# Patient Record
Sex: Female | Born: 1955
Health system: Southern US, Community
[De-identification: ages and names within clinical notes are randomized; demographics above are authoritative.]

## PROBLEM LIST (undated history)

## (undated) DIAGNOSIS — K5900 Constipation, unspecified: Secondary | ICD-10-CM

## (undated) DIAGNOSIS — M549 Dorsalgia, unspecified: Secondary | ICD-10-CM

## (undated) DIAGNOSIS — L509 Urticaria, unspecified: Secondary | ICD-10-CM

## (undated) DIAGNOSIS — M542 Cervicalgia: Secondary | ICD-10-CM

## (undated) DIAGNOSIS — E119 Type 2 diabetes mellitus without complications: Secondary | ICD-10-CM

## (undated) DIAGNOSIS — E785 Hyperlipidemia, unspecified: Secondary | ICD-10-CM

## (undated) DIAGNOSIS — I10 Essential (primary) hypertension: Secondary | ICD-10-CM

## (undated) DIAGNOSIS — Z0000 Encounter for general adult medical examination without abnormal findings: Secondary | ICD-10-CM

## (undated) DIAGNOSIS — Z79890 Hormone replacement therapy (postmenopausal): Secondary | ICD-10-CM

## (undated) DIAGNOSIS — E669 Obesity, unspecified: Secondary | ICD-10-CM

## (undated) DIAGNOSIS — A5901 Trichomonal vulvovaginitis: Secondary | ICD-10-CM

## (undated) HISTORY — DX: Trichomonal vulvovaginitis: A59.01

## (undated) HISTORY — DX: Type 2 diabetes mellitus without complications: E11.9

## (undated) HISTORY — DX: Obesity, unspecified: E66.9

## (undated) HISTORY — DX: Hormone replacement therapy: Z79.890

## (undated) HISTORY — DX: Constipation, unspecified: K59.00

## (undated) HISTORY — DX: Dorsalgia, unspecified: M54.9

## (undated) HISTORY — PX: APPENDECTOMY: SHX54

## (undated) HISTORY — DX: Encounter for general adult medical examination without abnormal findings: Z00.00

## (undated) HISTORY — DX: Cervicalgia: M54.2

## (undated) HISTORY — PX: TOTAL ABDOMINAL HYSTERECTOMY: SHX209

## (undated) HISTORY — PX: DENTAL SURGERY: SHX609

## (undated) HISTORY — DX: Urticaria, unspecified: L50.9

## (undated) HISTORY — DX: Essential (primary) hypertension: I10

## (undated) HISTORY — DX: Hyperlipidemia, unspecified: E78.5

---

## 2006-03-27 ENCOUNTER — Ambulatory Visit: Payer: Self-pay | Admitting: Family Medicine

## 2006-04-07 ENCOUNTER — Encounter (INDEPENDENT_AMBULATORY_CARE_PROVIDER_SITE_OTHER): Payer: Self-pay | Admitting: Family Medicine

## 2006-04-07 LAB — CONVERTED CEMR LAB: WBC, blood: 5.9 10*3/uL

## 2006-04-08 ENCOUNTER — Encounter (INDEPENDENT_AMBULATORY_CARE_PROVIDER_SITE_OTHER): Payer: Self-pay | Admitting: Family Medicine

## 2006-04-10 ENCOUNTER — Ambulatory Visit (HOSPITAL_COMMUNITY): Admission: RE | Admit: 2006-04-10 | Discharge: 2006-04-10 | Payer: Self-pay | Admitting: Family Medicine

## 2006-04-10 ENCOUNTER — Ambulatory Visit: Payer: Self-pay | Admitting: Family Medicine

## 2006-04-10 LAB — CONVERTED CEMR LAB: Pap Smear: NORMAL

## 2006-07-10 ENCOUNTER — Ambulatory Visit: Payer: Self-pay | Admitting: Family Medicine

## 2006-08-07 ENCOUNTER — Ambulatory Visit: Payer: Self-pay | Admitting: Family Medicine

## 2006-09-11 ENCOUNTER — Encounter: Payer: Self-pay | Admitting: Family Medicine

## 2006-09-11 DIAGNOSIS — I10 Essential (primary) hypertension: Secondary | ICD-10-CM

## 2006-09-11 DIAGNOSIS — K5900 Constipation, unspecified: Secondary | ICD-10-CM | POA: Insufficient documentation

## 2006-09-11 DIAGNOSIS — E1169 Type 2 diabetes mellitus with other specified complication: Secondary | ICD-10-CM | POA: Insufficient documentation

## 2006-09-11 DIAGNOSIS — E785 Hyperlipidemia, unspecified: Secondary | ICD-10-CM

## 2006-09-11 DIAGNOSIS — E669 Obesity, unspecified: Secondary | ICD-10-CM

## 2006-12-11 ENCOUNTER — Ambulatory Visit: Payer: Self-pay | Admitting: Family Medicine

## 2006-12-14 LAB — CONVERTED CEMR LAB
BUN: 10 mg/dL (ref 6–23)
Glucose, Bld: 146 mg/dL — ABNORMAL HIGH (ref 70–99)
Sodium: 137 meq/L (ref 135–145)

## 2007-01-07 ENCOUNTER — Ambulatory Visit: Payer: Self-pay | Admitting: Internal Medicine

## 2007-01-07 ENCOUNTER — Telehealth (INDEPENDENT_AMBULATORY_CARE_PROVIDER_SITE_OTHER): Payer: Self-pay | Admitting: *Deleted

## 2007-01-07 DIAGNOSIS — E119 Type 2 diabetes mellitus without complications: Secondary | ICD-10-CM | POA: Insufficient documentation

## 2007-01-22 ENCOUNTER — Encounter (INDEPENDENT_AMBULATORY_CARE_PROVIDER_SITE_OTHER): Payer: Self-pay | Admitting: Internal Medicine

## 2007-02-02 ENCOUNTER — Encounter (INDEPENDENT_AMBULATORY_CARE_PROVIDER_SITE_OTHER): Payer: Self-pay | Admitting: Family Medicine

## 2007-02-09 ENCOUNTER — Ambulatory Visit: Payer: Self-pay | Admitting: Family Medicine

## 2007-02-09 LAB — CONVERTED CEMR LAB: LDL Goal: 100 mg/dL

## 2007-03-02 ENCOUNTER — Telehealth (INDEPENDENT_AMBULATORY_CARE_PROVIDER_SITE_OTHER): Payer: Self-pay | Admitting: Family Medicine

## 2007-03-26 ENCOUNTER — Ambulatory Visit: Payer: Self-pay | Admitting: Family Medicine

## 2007-04-23 ENCOUNTER — Ambulatory Visit: Payer: Self-pay | Admitting: Family Medicine

## 2007-04-23 LAB — CONVERTED CEMR LAB: Glucose, Bld: 90 mg/dL

## 2007-05-14 ENCOUNTER — Encounter (INDEPENDENT_AMBULATORY_CARE_PROVIDER_SITE_OTHER): Payer: Self-pay | Admitting: Family Medicine

## 2007-05-14 ENCOUNTER — Ambulatory Visit (HOSPITAL_COMMUNITY): Admission: RE | Admit: 2007-05-14 | Discharge: 2007-05-14 | Payer: Self-pay | Admitting: Family Medicine

## 2007-05-15 ENCOUNTER — Telehealth (INDEPENDENT_AMBULATORY_CARE_PROVIDER_SITE_OTHER): Payer: Self-pay | Admitting: *Deleted

## 2007-05-15 LAB — CONVERTED CEMR LAB
Albumin: 4.3 g/dL (ref 3.5–5.2)
Calcium: 9.2 mg/dL (ref 8.4–10.5)
Glucose, Bld: 94 mg/dL (ref 70–99)
HDL: 41 mg/dL (ref >39)
Microalb Creat Ratio: 3.4 mg/g (ref 0.0–30.0)
Microalb, Ur: 0.81 mg/dL (ref 0.00–1.89)
Potassium: 4.1 meq/L (ref 3.5–5.3)
Total Bilirubin: 0.6 mg/dL (ref 0.3–1.2)
Total Protein: 7.7 g/dL (ref 6.0–8.3)

## 2007-07-20 ENCOUNTER — Ambulatory Visit: Payer: Self-pay | Admitting: Family Medicine

## 2007-07-20 DIAGNOSIS — M549 Dorsalgia, unspecified: Secondary | ICD-10-CM

## 2007-07-20 LAB — CONVERTED CEMR LAB
Glucose, Bld: 100 mg/dL
Hgb A1c MFr Bld: 5.8 %

## 2007-12-25 ENCOUNTER — Ambulatory Visit: Payer: Self-pay | Admitting: Family Medicine

## 2007-12-25 ENCOUNTER — Other Ambulatory Visit: Admission: RE | Admit: 2007-12-25 | Discharge: 2007-12-25 | Payer: Self-pay | Admitting: Family Medicine

## 2007-12-25 ENCOUNTER — Encounter (INDEPENDENT_AMBULATORY_CARE_PROVIDER_SITE_OTHER): Payer: Self-pay | Admitting: Family Medicine

## 2007-12-25 ENCOUNTER — Telehealth (INDEPENDENT_AMBULATORY_CARE_PROVIDER_SITE_OTHER): Payer: Self-pay | Admitting: *Deleted

## 2007-12-25 LAB — CONVERTED CEMR LAB
Bilirubin Urine: NEGATIVE
Ketones, urine, test strip: NEGATIVE
Urobilinogen, UA: 1
pH: 6

## 2007-12-28 ENCOUNTER — Encounter (INDEPENDENT_AMBULATORY_CARE_PROVIDER_SITE_OTHER): Payer: Self-pay | Admitting: Family Medicine

## 2007-12-31 ENCOUNTER — Telehealth (INDEPENDENT_AMBULATORY_CARE_PROVIDER_SITE_OTHER): Payer: Self-pay | Admitting: *Deleted

## 2008-01-20 ENCOUNTER — Encounter (INDEPENDENT_AMBULATORY_CARE_PROVIDER_SITE_OTHER): Payer: Self-pay | Admitting: Family Medicine

## 2008-01-21 ENCOUNTER — Telehealth (INDEPENDENT_AMBULATORY_CARE_PROVIDER_SITE_OTHER): Payer: Self-pay | Admitting: *Deleted

## 2008-01-21 LAB — CONVERTED CEMR LAB: Microalb Creat Ratio: 3 mg/g (ref 0.0–30.0)

## 2008-02-05 ENCOUNTER — Ambulatory Visit: Payer: Self-pay | Admitting: Family Medicine

## 2008-02-05 ENCOUNTER — Telehealth (INDEPENDENT_AMBULATORY_CARE_PROVIDER_SITE_OTHER): Payer: Self-pay | Admitting: *Deleted

## 2008-02-08 ENCOUNTER — Telehealth (INDEPENDENT_AMBULATORY_CARE_PROVIDER_SITE_OTHER): Payer: Self-pay | Admitting: *Deleted

## 2008-03-17 ENCOUNTER — Ambulatory Visit: Payer: Self-pay | Admitting: Family Medicine

## 2008-03-17 LAB — CONVERTED CEMR LAB: Hgb A1c MFr Bld: 7 %

## 2008-03-18 ENCOUNTER — Encounter (INDEPENDENT_AMBULATORY_CARE_PROVIDER_SITE_OTHER): Payer: Self-pay | Admitting: Family Medicine

## 2008-03-18 LAB — CONVERTED CEMR LAB
ALT: 21 units/L (ref 0–35)
AST: 22 units/L (ref 0–37)
Albumin: 4.4 g/dL (ref 3.5–5.2)
Alkaline Phosphatase: 83 units/L (ref 39–117)
Bilirubin, Direct: 0.1 mg/dL (ref 0.0–0.3)
Indirect Bilirubin: 0.6 mg/dL (ref 0.0–0.9)

## 2008-03-24 ENCOUNTER — Encounter (INDEPENDENT_AMBULATORY_CARE_PROVIDER_SITE_OTHER): Payer: Self-pay | Admitting: Family Medicine

## 2008-03-24 LAB — HM DIABETES EYE EXAM: HM Diabetic Eye Exam: NORMAL

## 2008-04-27 LAB — CONVERTED CEMR LAB
OCCULT 1: NEGATIVE
OCCULT 2: NEGATIVE

## 2008-04-28 ENCOUNTER — Ambulatory Visit: Payer: Self-pay | Admitting: Family Medicine

## 2008-05-02 LAB — CONVERTED CEMR LAB
AST: 19 units/L (ref 0–37)
Alkaline Phosphatase: 88 units/L (ref 39–117)
CO2: 20 meq/L (ref 19–32)
Creatinine, Ser: 0.88 mg/dL (ref 0.40–1.20)
Glucose, Bld: 139 mg/dL — ABNORMAL HIGH (ref 70–99)
Sodium: 138 meq/L (ref 135–145)
Total Bilirubin: 0.6 mg/dL (ref 0.3–1.2)
Total Protein: 8 g/dL (ref 6.0–8.3)
Triglycerides: 114 mg/dL (ref <150)
VLDL: 23 mg/dL (ref 0–40)

## 2008-05-26 ENCOUNTER — Ambulatory Visit (HOSPITAL_COMMUNITY): Admission: RE | Admit: 2008-05-26 | Discharge: 2008-05-26 | Payer: Self-pay | Admitting: *Deleted

## 2008-06-02 ENCOUNTER — Ambulatory Visit: Payer: Self-pay | Admitting: Family Medicine

## 2008-06-02 DIAGNOSIS — G5600 Carpal tunnel syndrome, unspecified upper limb: Secondary | ICD-10-CM

## 2008-06-02 HISTORY — DX: Carpal tunnel syndrome, unspecified upper limb: G56.00

## 2008-06-30 ENCOUNTER — Ambulatory Visit: Payer: Self-pay | Admitting: Family Medicine

## 2008-07-08 ENCOUNTER — Ambulatory Visit (HOSPITAL_COMMUNITY): Admission: RE | Admit: 2008-07-08 | Discharge: 2008-07-08 | Payer: Self-pay | Admitting: Family Medicine

## 2008-07-08 ENCOUNTER — Ambulatory Visit: Payer: Self-pay | Admitting: Family Medicine

## 2008-07-20 ENCOUNTER — Ambulatory Visit (HOSPITAL_COMMUNITY): Admission: RE | Admit: 2008-07-20 | Discharge: 2008-07-20 | Payer: Self-pay | Admitting: Family Medicine

## 2008-07-20 ENCOUNTER — Ambulatory Visit: Payer: Self-pay | Admitting: Family Medicine

## 2008-07-28 ENCOUNTER — Encounter (INDEPENDENT_AMBULATORY_CARE_PROVIDER_SITE_OTHER): Payer: Self-pay | Admitting: Family Medicine

## 2008-07-28 ENCOUNTER — Telehealth (INDEPENDENT_AMBULATORY_CARE_PROVIDER_SITE_OTHER): Payer: Self-pay | Admitting: *Deleted

## 2008-08-11 ENCOUNTER — Ambulatory Visit: Payer: Self-pay | Admitting: Family Medicine

## 2008-09-12 ENCOUNTER — Ambulatory Visit: Payer: Self-pay | Admitting: Family Medicine

## 2008-09-12 LAB — CONVERTED CEMR LAB
Glucose, Bld: 127 mg/dL
Hgb A1c MFr Bld: 6.3 %

## 2008-09-13 ENCOUNTER — Encounter (INDEPENDENT_AMBULATORY_CARE_PROVIDER_SITE_OTHER): Payer: Self-pay | Admitting: Family Medicine

## 2008-09-13 LAB — CONVERTED CEMR LAB
ALT: 19 units/L (ref 0–35)
AST: 19 units/L (ref 0–37)
Alkaline Phosphatase: 87 units/L (ref 39–117)
Calcium: 9.6 mg/dL (ref 8.4–10.5)
Creatinine, Ser: 0.86 mg/dL (ref 0.40–1.20)
Potassium: 4.2 meq/L (ref 3.5–5.3)
Total Protein: 7.9 g/dL (ref 6.0–8.3)

## 2008-10-20 ENCOUNTER — Ambulatory Visit: Payer: Self-pay | Admitting: Family Medicine

## 2008-10-31 ENCOUNTER — Ambulatory Visit: Payer: Self-pay | Admitting: Family Medicine

## 2008-11-01 ENCOUNTER — Encounter (INDEPENDENT_AMBULATORY_CARE_PROVIDER_SITE_OTHER): Payer: Self-pay | Admitting: Family Medicine

## 2008-11-25 ENCOUNTER — Ambulatory Visit: Payer: Self-pay | Admitting: Family Medicine

## 2008-11-25 DIAGNOSIS — F4321 Adjustment disorder with depressed mood: Secondary | ICD-10-CM

## 2008-11-25 HISTORY — DX: Adjustment disorder with depressed mood: F43.21

## 2009-05-12 ENCOUNTER — Ambulatory Visit: Payer: Self-pay | Admitting: Family Medicine

## 2009-05-12 LAB — CONVERTED CEMR LAB: Hgb A1c MFr Bld: 7.1 %

## 2009-05-18 ENCOUNTER — Encounter (INDEPENDENT_AMBULATORY_CARE_PROVIDER_SITE_OTHER): Payer: Self-pay | Admitting: Family Medicine

## 2009-05-19 ENCOUNTER — Encounter (INDEPENDENT_AMBULATORY_CARE_PROVIDER_SITE_OTHER): Payer: Self-pay | Admitting: *Deleted

## 2009-05-19 LAB — CONVERTED CEMR LAB
ALT: 15 units/L (ref 0–35)
Albumin: 4.4 g/dL (ref 3.5–5.2)
Alkaline Phosphatase: 83 units/L (ref 39–117)
CO2: 24 meq/L (ref 19–32)
Calcium: 9.8 mg/dL (ref 8.4–10.5)
Creatinine, Ser: 0.95 mg/dL (ref 0.40–1.20)
Glucose, Bld: 111 mg/dL — ABNORMAL HIGH (ref 70–99)
HCT: 41.6 % (ref 36.0–46.0)
HDL: 39 mg/dL — ABNORMAL LOW (ref >39)
Ketones, ur: NEGATIVE mg/dL
Lymphocytes Relative: 60 % — ABNORMAL HIGH (ref 12–46)
Lymphs Abs: 3.3 10*3/uL (ref 0.7–4.0)
MCV: 86 fL (ref 78.0–100.0)
Microalb Creat Ratio: 3.3 mg/g (ref 0.0–30.0)
Monocytes Absolute: 0.4 10*3/uL (ref 0.1–1.0)
Neutrophils Relative %: 32 % — ABNORMAL LOW (ref 43–77)
Nitrite: NEGATIVE
Protein, ur: NEGATIVE mg/dL
RBC / HPF: NONE SEEN (ref <3)
RBC: 4.84 M/uL (ref 3.87–5.11)
Specific Gravity, Urine: 1.017 (ref 1.005–1.030)
Triglycerides: 176 mg/dL — ABNORMAL HIGH (ref <150)

## 2009-05-24 ENCOUNTER — Encounter (INDEPENDENT_AMBULATORY_CARE_PROVIDER_SITE_OTHER): Payer: Self-pay | Admitting: Family Medicine

## 2009-05-26 ENCOUNTER — Ambulatory Visit: Payer: Self-pay | Admitting: Family Medicine

## 2009-05-26 LAB — HM COLONOSCOPY

## 2009-06-27 ENCOUNTER — Encounter (INDEPENDENT_AMBULATORY_CARE_PROVIDER_SITE_OTHER): Payer: Self-pay | Admitting: Family Medicine

## 2009-06-29 ENCOUNTER — Ambulatory Visit: Payer: Self-pay | Admitting: Family Medicine

## 2009-06-29 DIAGNOSIS — J209 Acute bronchitis, unspecified: Secondary | ICD-10-CM

## 2010-05-28 ENCOUNTER — Ambulatory Visit: Payer: Self-pay | Admitting: Family Medicine

## 2010-05-28 DIAGNOSIS — R599 Enlarged lymph nodes, unspecified: Secondary | ICD-10-CM

## 2010-05-28 HISTORY — DX: Enlarged lymph nodes, unspecified: R59.9

## 2010-05-28 LAB — CONVERTED CEMR LAB: Glucose, Bld: 133 mg/dL

## 2010-05-28 LAB — HM DIABETES FOOT EXAM

## 2010-10-04 ENCOUNTER — Ambulatory Visit: Payer: Self-pay | Admitting: Family Medicine

## 2010-10-04 ENCOUNTER — Other Ambulatory Visit
Admission: RE | Admit: 2010-10-04 | Discharge: 2010-10-04 | Payer: Self-pay | Source: Home / Self Care | Admitting: Family Medicine

## 2010-10-04 LAB — CONVERTED CEMR LAB: OCCULT 1: NEGATIVE

## 2010-10-11 ENCOUNTER — Encounter: Payer: Self-pay | Admitting: Family Medicine

## 2010-11-01 ENCOUNTER — Ambulatory Visit (HOSPITAL_COMMUNITY)
Admission: RE | Admit: 2010-11-01 | Discharge: 2010-11-01 | Payer: Self-pay | Source: Home / Self Care | Attending: Family Medicine | Admitting: Family Medicine

## 2010-11-04 ENCOUNTER — Encounter: Payer: Self-pay | Admitting: Family Medicine

## 2010-11-05 ENCOUNTER — Encounter: Payer: Self-pay | Admitting: Family Medicine

## 2010-11-13 NOTE — Assessment & Plan Note (Signed)
Summary: new patient   Vital Signs:  Patient profile:   55 year old female Menstrual status:  hysterectomy Height:      62 inches Weight:      185.75 pounds BMI:     34.10 O2 Sat:      96 % on Room air Pulse rate:   81 / minute BP sitting:   140 / 80  (left arm) CC: hasn't been on her medication since last month she has been checking her blood sugar it has been up over the summer months/knot under jaw line on right,  she noticed it this am. room 3 Is Patient Diabetic? Yes Did you bring your meter with you today? No   Primary Sheleen Conchas:  Esperanza Sheets PA  CC:  hasn't been on her medication since last month she has been checking her blood sugar it has been up over the summer months/knot under jaw line on right and she noticed it this am. room 3.  History of Present Illness: New pt here to establish care with new PCP.  concerned about a lump in her Rt neck. She noticed it this am after awoke, but is smaller now.  No pain.  no fever or chills.  Hx of DM.  Pt discontinued Metformin about 1 mos ago.  Since then blood sugar 96-250.  When on Metformin blood sugars ran low 100's or less taking once a day.  States would sometimes get low blood sugars if took Metformin two times a day.  Hx of HTN.  Not taking meds prev prescribed.  No HA, chest pain or pressure.   Hx of Hyperlipidemia.  Pt states when she took Pravastatin she would get a sore thoat so discontinued that too.  Last pap and mamm  > 1 yr ago. No previous colonoscopy. Eye exam Aug 2010, Dr Nile Riggs     Allergies (verified): 1)  ! Pravachol  Past History:  Past medical, surgical, family and social histories (including risk factors) reviewed, and no changes noted (except as noted below).  Past Medical History: Reviewed history from 03/17/2008 and no changes required. Current Problems:  NECK PAIN, LEFT (ICD-723.1) TRICHOMONAL VAGINITIS (ICD-131.01) BACK PAIN (ICD-724.5) WELL ADULT EXAM (ICD-V70.0) DIABETES MELLITUS,  TYPE II, CONTROLLED (ICD-250.00) HRT (ICD-V07.4) OBESITY NOS (ICD-278.00) CONSTIPATION (ICD-564.00) HYPERTENSION (ICD-401.9) HYPERLIPIDEMIA (ICD-272.4)  Past Surgical History: Reviewed history from 05/12/2009 and no changes required. 1. Total abdominal hysterectomy -fibroids 2. Appendectomy 3. Wisdom Teeth  Family History: Reviewed history from 05/12/2009 and no changes required. Father: Living  TIA, HTN Mother: Died 39 Pneumonia Siblings: Sisters x 2: HTN; 2 Brothers:  - W&L KIds: [id number] Daughter 35 Breast Cancer; Son 25 W&L  Social History: Reviewed history from 05/12/2009 and no changes required. 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 ENT:  Denies earache, nasal congestion, sinus pressure, and sore throat. CV:  Denies chest pain or discomfort and palpitations. Resp:  Denies cough and shortness of breath. Endo:  Denies excessive thirst and excessive urination.  Physical Exam  General:  Well-developed,well-nourished,in no acute distress; alert,appropriate and cooperative throughout examination Head:  Normocephalic and atraumatic without obvious abnormalities. No apparent alopecia or balding. Ears:  External ear exam shows no significant lesions or deformities.  Otoscopic examination reveals clear canals, tympanic membranes are intact bilaterally without bulging, retraction, inflammation or discharge. Hearing is grossly normal bilaterally. Nose:  External nasal  examination shows no deformity or inflammation. Nasal mucosa are pink and moist without lesions or exudates. Mouth:  Oral mucosa and oropharynx without lesions or exudates.   Neck:  No deformities, masses, or tenderness noted. Lungs:  Normal respiratory effort, chest expands symmetrically. Lungs are clear to auscultation, no crackles or wheezes. Heart:   Normal rate and regular rhythm. S1 and S2 normal without gallop, murmur, click, rub or other extra sounds. Pulses:  R posterior tibial normal, R dorsalis pedis normal, L posterior tibial normal, and L dorsalis pedis normal.   Extremities:  No clubbing, cyanosis, edema, or deformity noted with normal full range of motion of all joints.   Neurologic:  alert & oriented X3, sensation intact to light touch, gait normal, and DTRs symmetrical and normal.   Cervical Nodes:  1 shotty bilat submandibular node.  Rt side sl larger than lt.  Both smooth, mobile and nontender. Psych:  Cognition and judgment appear intact. Alert and cooperative with normal attention span and concentration. No apparent delusions, illusions, hallucinations  Diabetes Management Exam:    Foot Exam (with socks and/or shoes not present):       Sensory-Monofilament:          Left foot: normal          Right foot: normal       Inspection:          Left foot: abnormal             Comments: callous planter 5th MTP          Right foot: abnormal             Comments: callous planter 5th MTP       Nails:          Left foot: normal          Right foot: normal   Impression & Recommendations:  Problem # 1:  DIABETES MELLITUS, TYPE II, CONTROLLED (ICD-250.00) Assessment Deteriorated  The following medications were removed from the medication list:    Lisinopril-hydrochlorothiazide 20-25 Mg Tabs (Lisinopril-hydrochlorothiazide) ..... One daily Her updated medication list for this problem includes:    Aspirin 81 Mg Tbec (Aspirin) ..... Once daily    Metformin Hcl 500 Mg Tabs (Metformin hcl) .Marland Kitchen..Marland Kitchen Two once daily    Lisinopril 10 Mg Tabs (Lisinopril) .Marland Kitchen... Take 1 daily  Orders: T-Comprehensive Metabolic Panel [id number]) T- Hemoglobin A1C ([id number]) T-Urine Microalbumin w/creat. ratio ([id number]) Glucose, (CBG) (69629)  Problem # 2:  HYPERTENSION (ICD-401.9) Assessment: Deteriorated  The following medications were  removed from the medication list:    Lisinopril-hydrochlorothiazide 20-25 Mg Tabs (Lisinopril-hydrochlorothiazide) ..... One daily Her updated medication list for this problem includes:    Lisinopril 10 Mg Tabs (Lisinopril) .Marland Kitchen... Take 1 daily  BP today: 140/80 Prior BP: 137/82 (06/29/2009)  Prior 10 Yr Risk Heart Disease: 17 % (05/26/2009)  Labs Reviewed: K+: 4.5 (05/18/2009) Creat: : 0.95 (05/18/2009)   Chol: 226 (05/18/2009)   HDL: 39 (05/18/2009)   LDL: 152 (05/18/2009)   TG: 176 (05/18/2009)  Orders: T-Comprehensive Metabolic Panel ([id number])  Problem # 3:  HYPERLIPIDEMIA (ICD-272.4) Assessment: Comment Only  The following medications were removed from the medication list:    Pravachol 40 Mg Tabs (Pravastatin sodium) ..... One daily  Orders: T-Lipid Profile ([id number])  Problem # 4:  LYMPHADENOPATHY (ICD-785.6) Assessment: New  To monitor.  Discussed lymph nodes.  To call if persists in 7-10 days.  The following medications were removed from the medication list:  Zithromax Z-pak 250 Mg Tabs (Azithromycin) .Marland Kitchen... As directed  Orders: T-CBC No Diff ([id number])  Complete Medication List: 1)  Aspirin 81 Mg Tbec (Aspirin) .... Once daily 2)  Daily Multiple Vitamins Tabs (Multiple vitamin) .... One by mouth daily 3)  Fish Oil 1000 Mg Caps (Omega-3 fatty acids) .... One daily 4)  Metformin Hcl 500 Mg Tabs (Metformin hcl) .... Two once daily 5)  Lisinopril 10 Mg Tabs (Lisinopril) .... Take 1 daily  Other Orders: T-TSH ([id number])  Patient Instructions: 1)  Please schedule a follow-up appointment in 3 months. 2)  See your eye doctor yearly to check for diabetic eye damage. 3)  Check your feet each night for sore areas, calluses or signs of infection. 4)  Restart your Metfomin once daily and Lisinopril for your blood pressure and kidney protection. Prescriptions: METFORMIN HCL 500 MG  TABS (METFORMIN HCL) two once daily  #30 x 3   Entered and Authorized by:    Esperanza Sheets PA   Signed by:   Esperanza Sheets PA on 05/28/2010   Method used:   Electronically to        [facility]  [address] (retail)       [address]       [place], [place]  [address]       Ph: [phone]       Fax: [phone]   RxID:   [id number] METFORMIN HCL 500 MG  TABS (METFORMIN HCL) two times a day  #60 x 3   Entered and Authorized by:   Esperanza Sheets PA   Signed by:   Esperanza Sheets PA on 05/28/2010   Method used:   Electronically to        [facility]  [address] (retail)       [address]       [place], [place]  [address]       Ph: [phone]       Fax: [phone]   RxID:   [id number] LISINOPRIL 10 MG TABS (LISINOPRIL) take 1 daily  #30 x 3   Entered and Authorized by:   Esperanza Sheets PA   Signed by:   Esperanza Sheets PA on 05/28/2010   Method used:   Electronically to        [facility]  [address] (retail)       [address]       [place], [place]  [address]       Ph: [phone]       Fax: [phone]   RxID:   [id number]  Metformin #60 two times a day was cancelled at the pharmacy. Esperanza Sheets PA  May 28, 2010 3:02 PM  Laboratory Results   Blood Tests   Date/Time Received: May 28, 2010 3:24 PM  Date/Time Reported: May 28, 2010 3:24 PM   Glucose (random): 133 mg/dL   (Normal Range: 67-672)

## 2010-11-15 NOTE — Assessment & Plan Note (Signed)
Summary: physical   Vital Signs:  Patient profile:   55 year old female Menstrual status:  hysterectomy Height:      62 inches Weight:      184 pounds BMI:     33.78 O2 Sat:      99 % on Room air Pulse rate:   96 / minute Pulse rhythm:   regular BP sitting:   120 / 90  (left arm)  Vitals Entered By: Adella Hare LPN (October 04, 2010 2:31 PM)  Nutrition Counseling: Patient's BMI is greater than 25 and therefore counseled on weight management options.  O2 Flow:  Room air CC: physical Is Patient Diabetic? Yes Did you bring your meter with you today? No  Vision Screening:Left eye w/o correction: 20 / 20 Right Eye w/o correction: 20 / 20 Both eyes w/o correction:  20/ 20        Vision Entered By: Adella Hare LPN (October 04, 2010 2:31 PM)   Primary Care Provider:  Syliva Overman MD  CC:  physical.  History of Present Illness: Reports  that she has been doing well. Denies recent fever or chills. Denies sinus pressure, nasal congestion , ear pain or sore throat. Denies chest congestion, or cough productive of sputum. Denies chest pain, palpitations, PND, orthopnea or leg swelling. Denies abdominal pain, nausea, vomitting, diarrhea or constipation. Denies change in bowel movements or bloody stool. Denies dysuria , frequency, incontinence or hesitancy. Denies  joint pain, swelling, or reduced mobility. Denies headaches, vertigo, seizures. Denies depression, anxiety or insomnia. Denies  rash, lesions, or itch.     Current Medications (verified): 1)  Aspirin 81 Mg Tbec (Aspirin) .... Once Daily 2)  Daily Multiple Vitamins  Tabs (Multiple Vitamin) .... One By Mouth Daily 3)  Fish Oil 1000 Mg Caps (Omega-3 Fatty Acids) .... One Daily 4)  Metformin Hcl 500 Mg  Tabs (Metformin Hcl) .... Two Once Daily 5)  Lisinopril 10 Mg Tabs (Lisinopril) .... Take 1 Daily  Allergies (verified): 1)  ! Pravachol  Review of Systems      See HPI Eyes:  Denies discharge, eye  pain, and red eye. Endo:  Denies cold intolerance, excessive hunger, excessive thirst, excessive urination, and heat intolerance. Heme:  Denies abnormal bruising and bleeding. Allergy:  Denies hives or rash and itching eyes.  Physical Exam  General:  Well-developed,well-nourished,in no acute distress; alert,appropriate and cooperative throughout examination Head:  Normocephalic and atraumatic without obvious abnormalities. No apparent alopecia or balding. Eyes:  No corneal or conjunctival inflammation noted. EOMI. Perrla. Funduscopic exam benign, without hemorrhages, exudates or papilledema. Vision grossly normal. Ears:  External ear exam shows no significant lesions or deformities.  Otoscopic examination reveals clear canals, tympanic membranes are intact bilaterally without bulging, retraction, inflammation or discharge. Hearing is grossly normal bilaterally. Nose:  External nasal examination shows no deformity or inflammation. Nasal mucosa are pink and moist without lesions or exudates. Mouth:  pharynx pink and moist and fair dentition.   Neck:  No deformities, masses, or tenderness noted. Chest Wall:  No deformities, masses, or tenderness noted. Breasts:  No mass, nodules, thickening, tenderness, bulging, retraction, inflamation, nipple discharge or skin changes noted.   Lungs:  Normal respiratory effort, chest expands symmetrically. Lungs are clear to auscultation, no crackles or wheezes. Heart:  Normal rate and regular rhythm. S1 and S2 normal without gallop, murmur, click, rub or other extra sounds. Abdomen:  Bowel sounds positive,abdomen soft and non-tender without masses, organomegaly or hernias noted. Rectal:  No external  abnormalities noted. Normal sphincter tone. No rectal masses or tenderness. Genitalia:  Normal introitus for age, no external lesions, no vaginal discharge, mucosa pink and moist, no vaginal or cervical lesions, no vaginal atrophy, no friaility or hemorrhage, no  adnexal masses or tenderness. Uterus absent. Msk:  No deformity or scoliosis noted of thoracic or lumbar spine.   Pulses:  R and L carotid,radial,femoral,dorsalis pedis and posterior tibial pulses are full and equal bilaterally Extremities:  No clubbing, cyanosis, edema, or deformity noted with normal full range of motion of all joints.   Neurologic:  No cranial nerve deficits noted. Station and gait are normal. Plantar reflexes are down-going bilaterally. DTRs are symmetrical throughout. Sensory, motor and coordinative functions appear intact. Skin:  Intact without suspicious lesions or rashes Cervical Nodes:  No lymphadenopathy noted Axillary Nodes:  No palpable lymphadenopathy Inguinal Nodes:  No significant adenopathy Psych:  Cognition and judgment appear intact. Alert and cooperative with normal attention span and concentration. No apparent delusions, illusions, hallucinations   Impression & Recommendations:  Problem # 1:  HYPERTENSION (ICD-401.9) Assessment Improved  Her updated medication list for this problem includes:    Lisinopril 10 Mg Tabs (Lisinopril) .Marland Kitchen... Take 1 daily Patient advised to follow low sodium diet rich in fruit and vegetables, and to commit to at least 30 minutes 5 days per week of regular exercise , to improve blood presure control.   Orders: T-Basic Metabolic Panel ([id number])  BP today: 120/90 Prior BP: 140/80 (05/28/2010)  Prior 10 Yr Risk Heart Disease: 17 % (05/26/2009)  Labs Reviewed: K+: 4.5 (05/18/2009) Creat: : 0.95 (05/18/2009)   Chol: 226 (05/18/2009)   HDL: 39 (05/18/2009)   LDL: 152 (05/18/2009)   TG: 176 (05/18/2009)  Problem # 2:  HYPERLIPIDEMIA (ICD-272.4) Assessment: Comment Only  Orders: T-Hepatic Function [id number]) T-Lipid Profile [id number]) Low fat dietdiscussed and encouraged  Labs Reviewed: SGOT: 19 (05/18/2009)   SGPT: 15 (05/18/2009)  Lipid Goals: Chol Goal: 200 (12/11/2006)   HDL Goal: 40 (12/11/2006)   LDL  Goal: 100 (02/09/2007)   TG Goal: 150 (12/11/2006)  Prior 10 Yr Risk Heart Disease: 17 % (05/26/2009)   HDL:39 (05/18/2009), 35 (04/28/2008)  LDL:152 (05/18/2009), 93 (53/66/4403)  Chol:226 (05/18/2009), 151 (04/28/2008)  Trig:176 (05/18/2009), 114 (04/28/2008)  Problem # 3:  DIABETES MELLITUS, TYPE II, CONTROLLED (ICD-250.00) Assessment: Comment Only  Her updated medication list for this problem includes:    Aspirin 81 Mg Tbec (Aspirin) ..... Once daily    Metformin Hcl 500 Mg Tabs (Metformin hcl) .Marland Kitchen..Marland Kitchen Two once daily    Lisinopril 10 Mg Tabs (Lisinopril) .Marland Kitchen... Take 1 daily Patient advised to reduce carbs and sweets, commit to regular physical activity, take meds as prescribed, test blood sugars as directed, and attempt to lose weight , to improve blood sugar control.  Orders: T- Hemoglobin A1C ([id number]) T-Urine Microalbumin w/creat. ratio [id number]) Ophthalmology Referral (Ophthalmology)  Labs Reviewed: Creat: 0.95 (05/18/2009)     Last Eye Exam: normal (03/24/2008) Reviewed HgBA1c results: 7.1 (05/12/2009)  6.3 (09/12/2008)  Problem # 4:  OBESITY NOS (ICD-278.00) Assessment: Unchanged  Ht: 62 (10/04/2010)   Wt: 184 (10/04/2010)   BMI: 33.78 (10/04/2010) therapeutic lifestyle change discussed and encouraged  Complete Medication List: 1)  Aspirin 81 Mg Tbec (Aspirin) .... Once daily 2)  Daily Multiple Vitamins Tabs (Multiple vitamin) .... One by mouth daily 3)  Fish Oil 1000 Mg Caps (Omega-3 fatty acids) .... One daily 4)  Metformin Hcl 500 Mg Tabs (Metformin hcl) .... Two once daily 5)  Lisinopril  10 Mg Tabs (Lisinopril) .... Take 1 daily  Other Orders: Radiology Referral (Radiology) T-CBC w/Diff [id number]) T-TSH [id number]) Hemoccult Guaiac-1 spec.(in office) (82270) Pap Smear (62376)  Patient Instructions: 1)  Please schedule a follow-up appointment in 3 months. 2)  It is important that you exercise regularly at least 20 minutes 5 times a week. If  you develop chest pain, have severe difficulty breathing, or feel very tired , stop exercising immediately and seek medical attention. 3)  You need to lose weight. Consider a lower calorie diet and regular exercise.  4)  BMP prior to visit, ICD-9: 5)  Hepatic Panel prior to visit, ICD-9: 6)  Lipid Panel prior to visit, ICD-9: 7)  TSH prior to visit, ICD-9: 8)  CBC w/ Diff prior to visit, ICD-9:  fasting asap 9)  HbgA1C prior to visit, ICD-9: 10)  Urine Microalbumin prior to visit, ICD-9:   Orders Added: 1)  Est. Patient 40-64 years [99396] 2)  Radiology Referral [Radiology] 3)  T-Basic Metabolic Panel [[id number]] 4)  T-Hepatic Function [[id number]] 5)  T-Lipid Profile [80061-22930] 6)  T-CBC w/Diff [[id number]] 7)  T- Hemoglobin A1C [83036-23375] 8)  T-TSH [[id number]] 9)  T-Urine Microalbumin w/creat. ratio [[id number]] 10)  Ophthalmology Referral [Ophthalmology] 11)  Hemoccult Guaiac-1 spec.(in office) [82270] 12)  Pap Smear [[id number]]    Laboratory Results  Date/Time Received: October 04, 2010 3:44 PM  Date/Time Reported: October 04, 2010 3:44 PM   Stool - Occult Blood Hemmoccult #1: negative Date: 10/04/2010

## 2010-11-15 NOTE — Miscellaneous (Signed)
  Clinical Lists Changes  Medications: Added new medication of FLUCONAZOLE 150 MG TABS (FLUCONAZOLE) Take 1 tablet by mouth once a day - Signed Rx of FLUCONAZOLE 150 MG TABS (FLUCONAZOLE) Take 1 tablet by mouth once a day;  #1 x 0;  Signed;  Entered by: Syliva Overman MD;  Authorized by: Syliva Overman MD;  Method used: Historical    Prescriptions: FLUCONAZOLE 150 MG TABS (FLUCONAZOLE) Take 1 tablet by mouth once a day  #1 x 0   Entered and Authorized by:   Syliva Overman MD   Signed by:   Syliva Overman MD on 10/11/2010   Method used:   Historical   RxID:   [id number]   Appended Document: pap Prescriptions: FLUCONAZOLE 150 MG TABS (FLUCONAZOLE) Take 1 tablet by mouth once a day  #1 x 0   Entered by:   Adella Hare LPN   Authorized by:   Syliva Overman MD   Signed by:   Adella Hare LPN on 24/40/1027   Method used:   Electronically to        [facility]  [address] (retail)       [address]       [place], [place]  [address]       Ph: [phone]       Fax: [phone]   RxID:   [id number]  patient aware

## 2010-12-02 ENCOUNTER — Emergency Department (HOSPITAL_COMMUNITY)
Admission: EM | Admit: 2010-12-02 | Discharge: 2010-12-03 | Disposition: A | Discharge status: Home / Self Care | Payer: PRIVATE HEALTH INSURANCE | Attending: Emergency Medicine | Admitting: Emergency Medicine

## 2010-12-02 ENCOUNTER — Emergency Department (HOSPITAL_COMMUNITY): Payer: PRIVATE HEALTH INSURANCE

## 2010-12-02 DIAGNOSIS — R197 Diarrhea, unspecified: Secondary | ICD-10-CM | POA: Insufficient documentation

## 2010-12-02 DIAGNOSIS — E119 Type 2 diabetes mellitus without complications: Secondary | ICD-10-CM | POA: Insufficient documentation

## 2010-12-02 DIAGNOSIS — Z79899 Other long term (current) drug therapy: Secondary | ICD-10-CM | POA: Insufficient documentation

## 2010-12-02 DIAGNOSIS — R112 Nausea with vomiting, unspecified: Secondary | ICD-10-CM | POA: Insufficient documentation

## 2010-12-02 DIAGNOSIS — R109 Unspecified abdominal pain: Secondary | ICD-10-CM | POA: Insufficient documentation

## 2010-12-02 LAB — CBC
MCH: 28.5 pg (ref 26.0–34.0)
MCV: 83.4 fL (ref 78.0–100.0)
Platelets: 338 10*3/uL (ref 150–400)
RBC: 5.29 MIL/uL — ABNORMAL HIGH (ref 3.87–5.11)

## 2010-12-02 LAB — COMPREHENSIVE METABOLIC PANEL
Albumin: 4.1 g/dL (ref 3.5–5.2)
BUN: 18 mg/dL (ref 6–23)
CO2: 20 mEq/L (ref 19–32)
Chloride: 100 mEq/L (ref 96–112)
Creatinine, Ser: 1.13 mg/dL (ref 0.4–1.2)
GFR calc non Af Amer: 50 mL/min — ABNORMAL LOW (ref >60)
Total Bilirubin: 0.8 mg/dL (ref 0.3–1.2)

## 2010-12-02 LAB — DIFFERENTIAL
Eosinophils Absolute: 0 10*3/uL (ref 0.0–0.7)
Eosinophils Relative: 0 % (ref 0–5)
Lymphs Abs: 1.2 10*3/uL (ref 0.7–4.0)
Monocytes Absolute: 0.3 10*3/uL (ref 0.1–1.0)
Monocytes Relative: 4 % (ref 3–12)
Neutrophils Relative %: 80 % — ABNORMAL HIGH (ref 43–77)

## 2010-12-02 LAB — URINALYSIS, ROUTINE W REFLEX MICROSCOPIC: Nitrite: NEGATIVE

## 2010-12-02 LAB — URINE MICROSCOPIC-ADD ON

## 2010-12-18 ENCOUNTER — Encounter: Payer: Self-pay | Admitting: Family Medicine

## 2011-01-01 ENCOUNTER — Encounter: Payer: Self-pay | Admitting: Family Medicine

## 2011-01-02 ENCOUNTER — Encounter: Payer: Self-pay | Admitting: Family Medicine

## 2011-01-03 ENCOUNTER — Ambulatory Visit: Payer: Self-pay | Admitting: Family Medicine

## 2011-11-07 ENCOUNTER — Other Ambulatory Visit: Payer: Self-pay | Admitting: Family Medicine

## 2011-11-07 DIAGNOSIS — Z139 Encounter for screening, unspecified: Secondary | ICD-10-CM

## 2011-11-11 ENCOUNTER — Ambulatory Visit (HOSPITAL_COMMUNITY)
Admission: RE | Admit: 2011-11-11 | Discharge: 2011-11-11 | Disposition: A | Discharge status: Home / Self Care | Payer: BC Managed Care – PPO | Source: Ambulatory Visit | Attending: Family Medicine | Admitting: Family Medicine

## 2011-11-11 DIAGNOSIS — Z139 Encounter for screening, unspecified: Secondary | ICD-10-CM

## 2011-11-11 DIAGNOSIS — Z1231 Encounter for screening mammogram for malignant neoplasm of breast: Secondary | ICD-10-CM | POA: Insufficient documentation

## 2011-11-18 ENCOUNTER — Encounter: Payer: Self-pay | Admitting: Family Medicine

## 2011-11-20 ENCOUNTER — Ambulatory Visit (INDEPENDENT_AMBULATORY_CARE_PROVIDER_SITE_OTHER): Payer: BC Managed Care – PPO | Admitting: Family Medicine

## 2011-11-20 ENCOUNTER — Encounter: Payer: Self-pay | Admitting: Family Medicine

## 2011-11-20 VITALS — BP 140/90 | HR 101 | Resp 18 | Ht 63.0 in | Wt 181.0 lb

## 2011-11-20 DIAGNOSIS — E559 Vitamin D deficiency, unspecified: Secondary | ICD-10-CM

## 2011-11-20 DIAGNOSIS — E785 Hyperlipidemia, unspecified: Secondary | ICD-10-CM

## 2011-11-20 DIAGNOSIS — I10 Essential (primary) hypertension: Secondary | ICD-10-CM

## 2011-11-20 DIAGNOSIS — E669 Obesity, unspecified: Secondary | ICD-10-CM

## 2011-11-20 DIAGNOSIS — E119 Type 2 diabetes mellitus without complications: Secondary | ICD-10-CM

## 2011-11-20 LAB — CBC
MCH: 28.6 pg (ref 26.0–34.0)
MCHC: 33.9 g/dL (ref 30.0–36.0)
Platelets: 360 10*3/uL (ref 150–400)

## 2011-11-20 LAB — LIPID PANEL: Cholesterol: 186 mg/dL (ref 0–200)

## 2011-11-20 MED ORDER — LISINOPRIL 10 MG PO TABS: 10.0000 mg | ORAL_TABLET | Freq: Every day | ORAL | Status: DC

## 2011-11-20 NOTE — Assessment & Plan Note (Signed)
Deteriorated. Patient re-educated about  the importance of commitment to a  minimum of 150 minutes of exercise per week. The importance of healthy food choices with portion control discussed. Encouraged to start a food diary, count calories and to consider  joining a support group. Sample diet sheets offered. Goals set by the patient for the next several months.    

## 2011-11-20 NOTE — Assessment & Plan Note (Signed)
Uncontrolled, start lisinopril

## 2011-11-20 NOTE — Patient Instructions (Signed)
F/u in 4 month.  Fasting labs today   EKG today.  It is important that you exercise regularly at least 30 minutes 5 times a week. If you develop chest pain, have severe difficulty breathing, or feel very tired, stop exercising immediately and seek medical attention    A healthy diet is rich in fruit, vegetables and whole grains. Poultry fish, nuts and beans are a healthy choice for protein rather then red meat. A low sodium diet and drinking 64 ounces of water daily is generally recommended. Oils and sweet should be limited. Carbohydrates especially for those who are diabetic or overweight, should be limited to 30-45 gram per meal. It is important to eat on a regular schedule, at least 3 times daily. Snacks should be primarily fruits, vegetables or nuts.  Weight loss goal of 10 pounds  Pls go to the diabetic class, call for your own appt.  Start med for blood pressure today, this has been sent in.  We will call re labs tomorrow and additional med will be started then

## 2011-11-20 NOTE — Assessment & Plan Note (Signed)
Labs today, once daily testing with supplies and meter, hold on result re meds

## 2011-11-21 LAB — COMPLETE METABOLIC PANEL WITH GFR
Albumin: 4.3 g/dL (ref 3.5–5.2)
BUN: 12 mg/dL (ref 6–23)
Calcium: 9.7 mg/dL (ref 8.4–10.5)
Chloride: 103 mEq/L (ref 96–112)
GFR, Est Non African American: 77 mL/min
Glucose, Bld: 145 mg/dL — ABNORMAL HIGH (ref 70–99)
Potassium: 4.1 mEq/L (ref 3.5–5.3)

## 2011-11-21 LAB — MICROALBUMIN / CREATININE URINE RATIO: Microalb, Ur: 0.5 mg/dL (ref 0.00–1.89)

## 2011-11-21 LAB — HEMOGLOBIN A1C: Hgb A1c MFr Bld: 8.5 % — ABNORMAL HIGH (ref <5.7)

## 2011-11-27 ENCOUNTER — Other Ambulatory Visit: Payer: Self-pay

## 2011-11-27 ENCOUNTER — Other Ambulatory Visit: Payer: Self-pay | Admitting: Family Medicine

## 2011-11-27 MED ORDER — VITAMIN D3 1.25 MG (50000 UT) PO CAPS: 50000.0000 [IU] | ORAL_CAPSULE | ORAL | Status: DC

## 2011-11-27 MED ORDER — SAXAGLIPTIN-METFORMIN ER 2.5-1000 MG PO TB24: ORAL_TABLET | ORAL | Status: DC

## 2011-12-01 DIAGNOSIS — E559 Vitamin D deficiency, unspecified: Secondary | ICD-10-CM | POA: Insufficient documentation

## 2011-12-01 NOTE — Assessment & Plan Note (Signed)
wekekly supplement for 6 months, f/u on lab

## 2011-12-01 NOTE — Assessment & Plan Note (Signed)
Hyperlipidemia:Low fat diet discussed and encouraged.  F/u labs

## 2011-12-01 NOTE — Progress Notes (Signed)
  Subjective:    Patient ID: Jennifer Velasquez, female    DOB: 10/02/1956, 56 y.o.   MRN: [id number]  HPI Pt here to re establish care. She has been sent in for a wellness eval from her job, states she was uninsured for a while and was therefore not coming to appts. She is diabetic and denies polyuria, polydipsia or blurred vision. Has not been testing blood sugars regularly. Has no specific concerns or complaints and "feels well"She does realize the need to come in for health checks however and intends to be diligent with this   Review of Systems See HPI Denies recent fever or chills. Denies sinus pressure, nasal congestion, ear pain or sore throat. Denies chest congestion, productive cough or wheezing. Denies chest pains, palpitations and leg swelling Denies abdominal pain, nausea, vomiting,diarrhea or constipation.   Denies dysuria, frequency, hesitancy or incontinence. Denies joint pain, swelling and limitation in mobility. Denies headaches, seizures, numbness, or tingling. Denies depression, anxiety or insomnia. Denies skin break down or rash.        Objective:   Physical Exam Patient alert and oriented and in no cardiopulmonary distress.  HEENT: No facial asymmetry, EOMI, no sinus tenderness,  oropharynx pink and moist.  Neck supple no adenopathy.  Chest: Clear to auscultation bilaterally.  CVS: S1, S2 no murmurs, no S3.  ABD: Soft non tender. Bowel sounds normal.  Ext: No edema  MS: Adequate ROM spine, shoulders, hips and knees.  Skin: Intact, no ulcerations or rash noted.  Psych: Good eye contact, normal affect. Memory intact not anxious or depressed appearing.  CNS: CN 2-12 intact, power, tone and sensation normal throughout. Diabetic Foot Check:  Appearance - no lesions, ulcers or calluses Skin - no unusual pallor or redness Sensation - grossly intact to light touch Monofilament testing -  Right - Great toe, medial, central, lateral ball and posterior foot  intact Left - Great toe, medial, central, lateral ball and posterior foot intact Pulses Left - Dorsalis Pedis and Posterior Tibia normal Right - Dorsalis Pedis and Posterior Tibia normal        Assessment & Plan:

## 2012-01-09 ENCOUNTER — Ambulatory Visit: Payer: BC Managed Care – PPO | Admitting: Family Medicine

## 2012-03-17 ENCOUNTER — Ambulatory Visit: Payer: BC Managed Care – PPO | Admitting: Family Medicine

## 2014-04-07 ENCOUNTER — Encounter: Payer: Self-pay | Admitting: Family Medicine

## 2015-04-12 ENCOUNTER — Encounter: Payer: Self-pay | Admitting: Family Medicine

## 2016-01-29 ENCOUNTER — Emergency Department (HOSPITAL_COMMUNITY): Payer: 59

## 2016-01-29 ENCOUNTER — Encounter (HOSPITAL_COMMUNITY): Payer: Self-pay | Admitting: *Deleted

## 2016-01-29 ENCOUNTER — Emergency Department (HOSPITAL_COMMUNITY)
Admission: EM | Admit: 2016-01-29 | Discharge: 2016-01-29 | Disposition: A | Discharge status: Home / Self Care | Payer: 59 | Attending: Emergency Medicine | Admitting: Emergency Medicine

## 2016-01-29 DIAGNOSIS — R072 Precordial pain: Secondary | ICD-10-CM | POA: Diagnosis not present

## 2016-01-29 DIAGNOSIS — E669 Obesity, unspecified: Secondary | ICD-10-CM | POA: Diagnosis not present

## 2016-01-29 DIAGNOSIS — E119 Type 2 diabetes mellitus without complications: Secondary | ICD-10-CM | POA: Insufficient documentation

## 2016-01-29 DIAGNOSIS — Z79891 Long term (current) use of opiate analgesic: Secondary | ICD-10-CM | POA: Insufficient documentation

## 2016-01-29 DIAGNOSIS — I10 Essential (primary) hypertension: Secondary | ICD-10-CM | POA: Diagnosis not present

## 2016-01-29 DIAGNOSIS — Z7982 Long term (current) use of aspirin: Secondary | ICD-10-CM | POA: Diagnosis not present

## 2016-01-29 DIAGNOSIS — R079 Chest pain, unspecified: Secondary | ICD-10-CM

## 2016-01-29 DIAGNOSIS — E785 Hyperlipidemia, unspecified: Secondary | ICD-10-CM | POA: Diagnosis not present

## 2016-01-29 LAB — BASIC METABOLIC PANEL
ANION GAP: 8 (ref 5–15)
BUN: 10 mg/dL (ref 6–20)
CHLORIDE: 105 mmol/L (ref 101–111)
CO2: 25 mmol/L (ref 22–32)
CREATININE: 0.84 mg/dL (ref 0.44–1.00)
Calcium: 9.1 mg/dL (ref 8.9–10.3)
GFR calc non Af Amer: >60 mL/min (ref >60)
Glucose, Bld: 183 mg/dL — ABNORMAL HIGH (ref 65–99)
Potassium: 3.9 mmol/L (ref 3.5–5.1)
Sodium: 138 mmol/L (ref 135–145)

## 2016-01-29 LAB — I-STAT CHEM 8, ED
BUN: 9 mg/dL (ref 6–20)
CHLORIDE: 103 mmol/L (ref 101–111)
CREATININE: 0.7 mg/dL (ref 0.44–1.00)
Calcium, Ion: 1.18 mmol/L (ref 1.13–1.30)
GLUCOSE: 182 mg/dL — AB (ref 65–99)
HCT: 47 % — ABNORMAL HIGH (ref 36.0–46.0)
Hemoglobin: 16 g/dL — ABNORMAL HIGH (ref 12.0–15.0)
POTASSIUM: 3.8 mmol/L (ref 3.5–5.1)
Sodium: 142 mmol/L (ref 135–145)
TCO2: 23 mmol/L (ref 0–100)

## 2016-01-29 LAB — CBC
HEMATOCRIT: 42.9 % (ref 36.0–46.0)
HEMOGLOBIN: 14.5 g/dL (ref 12.0–15.0)
MCH: 29.3 pg (ref 26.0–34.0)
MCHC: 33.8 g/dL (ref 30.0–36.0)
MCV: 86.7 fL (ref 78.0–100.0)
Platelets: 329 10*3/uL (ref 150–400)
RBC: 4.95 MIL/uL (ref 3.87–5.11)
RDW: 13.1 % (ref 11.5–15.5)
WBC: 6.6 10*3/uL (ref 4.0–10.5)

## 2016-01-29 LAB — I-STAT TROPONIN, ED: Troponin i, poc: 0 ng/mL (ref 0.00–0.08)

## 2016-01-29 NOTE — Discharge Instructions (Signed)
Follow-up with Dr. Lodema HongSimpson.  Nonspecific Chest Pain  Chest pain can be caused by many different conditions. There is always a chance that your pain could be related to something serious, such as a heart attack or a blood clot in your lungs. Chest pain can also be caused by conditions that are not life-threatening. If you have chest pain, it is very important to follow up with your health care provider. CAUSES  Chest pain can be caused by:  Heartburn.  Pneumonia or bronchitis.  Anxiety or stress.  Inflammation around your heart (pericarditis) or lung (pleuritis or pleurisy).  A blood clot in your lung.  A collapsed lung (pneumothorax). It can develop suddenly on its own (spontaneous pneumothorax) or from trauma to the chest.  Shingles infection (varicella-zoster virus).  Heart attack.  Damage to the bones, muscles, and cartilage that make up your chest wall. This can include:  Bruised bones due to injury.  Strained muscles or cartilage due to frequent or repeated coughing or overwork.  Fracture to one or more ribs.  Sore cartilage due to inflammation (costochondritis). RISK FACTORS  Risk factors for chest pain may include:  Activities that increase your risk for trauma or injury to your chest.  Respiratory infections or conditions that cause frequent coughing.  Medical conditions or overeating that can cause heartburn.  Heart disease or family history of heart disease.  Conditions or health behaviors that increase your risk of developing a blood clot.  Having had chicken pox (varicella zoster). SIGNS AND SYMPTOMS Chest pain can feel like:  Burning or tingling on the surface of your chest or deep in your chest.  Crushing, pressure, aching, or squeezing pain.  Dull or sharp pain that is worse when you move, cough, or take a deep breath.  Pain that is also felt in your back, neck, shoulder, or arm, or pain that spreads to any of these areas. Your chest pain may  come and go, or it may stay constant. DIAGNOSIS Lab tests or other studies may be needed to find the cause of your pain. Your health care provider may have you take a test called an ambulatory ECG (electrocardiogram). An ECG records your heartbeat patterns at the time the test is performed. You may also have other tests, such as:  Transthoracic echocardiogram (TTE). During echocardiography, sound waves are used to create a picture of all of the heart structures and to look at how blood flows through your heart.  Transesophageal echocardiogram (TEE).This is a more advanced imaging test that obtains images from inside your body. It allows your health care provider to see your heart in finer detail.  Cardiac monitoring. This allows your health care provider to monitor your heart rate and rhythm in real time.  Holter monitor. This is a portable device that records your heartbeat and can help to diagnose abnormal heartbeats. It allows your health care provider to track your heart activity for several days, if needed.  Stress tests. These can be done through exercise or by taking medicine that makes your heart beat more quickly.  Blood tests.  Imaging tests. TREATMENT  Your treatment depends on what is causing your chest pain. Treatment may include:  Medicines. These may include:  Acid blockers for heartburn.  Anti-inflammatory medicine.  Pain medicine for inflammatory conditions.  Antibiotic medicine, if an infection is present.  Medicines to dissolve blood clots.  Medicines to treat coronary artery disease.  Supportive care for conditions that do not require medicines. This may include:  Resting.  Applying heat or cold packs to injured areas.  Limiting activities until pain decreases. HOME CARE INSTRUCTIONS  If you were prescribed an antibiotic medicine, finish it all even if you start to feel better.  Avoid any activities that bring on chest pain.  Do not use any tobacco  products, including cigarettes, chewing tobacco, or electronic cigarettes. If you need help quitting, ask your health care provider.  Do not drink alcohol.  Take medicines only as directed by your health care provider.  Keep all follow-up visits as directed by your health care provider. This is important. This includes any further testing if your chest pain does not go away.  If heartburn is the cause for your chest pain, you may be told to keep your head raised (elevated) while sleeping. This reduces the chance that acid will go from your stomach into your esophagus.  Make lifestyle changes as directed by your health care provider. These may include:  Getting regular exercise. Ask your health care provider to suggest some activities that are safe for you.  Eating a heart-healthy diet. A registered dietitian can help you to learn healthy eating options.  Maintaining a healthy weight.  Managing diabetes, if necessary.  Reducing stress. SEEK MEDICAL CARE IF:  Your chest pain does not go away after treatment.  You have a rash with blisters on your chest.  You have a fever. SEEK IMMEDIATE MEDICAL CARE IF:   Your chest pain is worse.  You have an increasing cough, or you cough up blood.  You have severe abdominal pain.  You have severe weakness.  You faint.  You have chills.  You have sudden, unexplained chest discomfort.  You have sudden, unexplained discomfort in your arms, back, neck, or jaw.  You have shortness of breath at any time.  You suddenly start to sweat, or your skin gets clammy.  You feel nauseous or you vomit.  You suddenly feel light-headed or dizzy.  Your heart begins to beat quickly, or it feels like it is skipping beats. These symptoms may represent a serious problem that is an emergency. Do not wait to see if the symptoms will go away. Get medical help right away. Call your local emergency services (911 in the [place] Do not drive yourself to the  hospital.   This information is not intended to replace advice given to you by your health care provider. Make sure you discuss any questions you have with your health care provider.   Document Released: 07/10/2005 Document Revised: 10/21/2014 Document Reviewed: 05/06/2014 Elsevier Interactive Patient Education [employer]

## 2016-01-29 NOTE — ED Notes (Signed)
Pt here for evaluation of CP that has been intermittent for the past 2 weeks.  Pt states that the pain is 9/10.  Pain is worse when laying back.  While pain has been intermittent for last 2 weeks it increased in the past hour.

## 2016-01-29 NOTE — ED Provider Notes (Signed)
CSN: [id number]     Arrival date & time 01/29/16  1422 History   First MD Initiated Contact with Patient 01/29/16 1433     Chief Complaint  Patient presents with  . Chest Pain     Patient is a 60 y.o. female presenting with chest pain. The history is provided by the patient.  Chest Pain Pain location:  Substernal area Associated symptoms: no abdominal pain, no back pain, no headache, no nausea, no numbness, no shortness of breath, not vomiting and no weakness    patient presents with upper chest pain. She's had on and off for the last 2 weeks. More severe today. Worse with movements. No shortness of breath. It is dull. No fevers or chills. States she's taking many different medicines for it including reflux medications which did not help. No history of coronary artery disease but has not had a workup for it. She does not smoke. Does have a history of diabetes and hypertension.  Past Medical History  Diagnosis Date  . Neck pain     left   . Trichomonal vaginitis   . Back pain   . Routine general medical examination at a health care facility   . Type II or unspecified type diabetes mellitus without mention of complication, not stated as uncontrolled   . Need for prophylactic hormone replacement therapy (postmenopausal)   . Obesity, unspecified   . Unspecified constipation   . Unspecified essential hypertension   . Other and unspecified hyperlipidemia    Past Surgical History  Procedure Laterality Date  . Total abdominal hysterectomy      fibroids  . Appendectomy    . Dental surgery     Family History  Problem Relation Age of Onset  . Transient ischemic attack Father   . Hypertension Father   . Pneumonia Mother   . Hypertension Sister   . Hypertension Sister   . Cancer Daughter     breast    Social History  Substance Use Topics  . Smoking status: Former Games developermoker  . Smokeless tobacco: None  . Alcohol Use: Yes   OB History    No data available     Review of Systems   Constitutional: Negative for activity change and appetite change.  Eyes: Negative for pain.  Respiratory: Negative for chest tightness and shortness of breath.   Cardiovascular: Positive for chest pain. Negative for leg swelling.  Gastrointestinal: Negative for nausea, vomiting, abdominal pain and diarrhea.  Genitourinary: Negative for flank pain.  Musculoskeletal: Negative for back pain and neck stiffness.  Skin: Negative for rash.  Neurological: Negative for weakness, numbness and headaches.  Psychiatric/Behavioral: Negative for behavioral problems.      Allergies  Pravastatin sodium  Home Medications   Prior to Admission medications   Medication Sig Start Date End Date Taking? Authorizing Provider  aspirin (ASPIRIN LOW DOSE) 81 MG EC tablet Take 81-243 mg by mouth daily as needed for pain.    Yes Historical Provider, MD  DAILY MULTIPLE VITAMINS PO Take by mouth daily.     Yes Historical Provider, MD   BP 135/88 mmHg  Pulse 78  Resp 19  Wt 174 lb (78.926 kg)  SpO2 96% Physical Exam  Constitutional: She is oriented to person, place, and time. She appears well-developed and well-nourished.  HENT:  Head: Normocephalic and atraumatic.  Eyes: EOM are normal. Pupils are equal, round, and reactive to light.  Neck: No JVD present.  Cardiovascular: Normal rate and regular rhythm.   Pulmonary/Chest:  Effort normal and breath sounds normal. No respiratory distress. She exhibits tenderness.  Tenderness to upper chest wall.  Abdominal: Soft. Bowel sounds are normal. She exhibits no distension. There is no tenderness. There is no rebound and no guarding.  Musculoskeletal: Normal range of motion.  Neurological: She is alert and oriented to person, place, and time. No cranial nerve deficit.  Skin: Skin is warm.  Psychiatric: Her speech is normal.  Nursing note and vitals reviewed.   ED Course  Procedures (including critical care time) Labs Review Labs Reviewed  BASIC METABOLIC  PANEL - Abnormal; Notable for the following:    Glucose, Bld 183 (*)    All other components within normal limits  I-STAT CHEM 8, ED - Abnormal; Notable for the following:    Glucose, Bld 182 (*)    Hemoglobin 16.0 (*)    HCT 47.0 (*)    All other components within normal limits  CBC  I-STAT TROPOININ, ED    Imaging Review Dg Chest 2 View  01/29/2016  CLINICAL DATA:  Intermittent chest pain for 2 weeks. No known injury. Initial encounter. EXAM: CHEST  2 VIEW COMPARISON:  None. FINDINGS: The lungs are clear. Heart size is normal. No pneumothorax or pleural effusion. No focal bony abnormality. IMPRESSION: Negative chest. Electronically Signed   By: Drusilla Kanner M.D.   On: 01/29/2016 15:21   I have personally reviewed and evaluated these images and lab results as part of my medical decision-making.   EKG Interpretation   Date/Time:  Monday January 29 2016 14:33:27 EDT Ventricular Rate:  92 PR Interval:  138 QRS Duration: 89 QT Interval:  370 QTC Calculation: 458 R Axis:   51 Text Interpretation:  Sinus rhythm Abnormal R-wave progression, early  transition Confirmed by Rubin Payor  MD, Harrold Donath [phone]) on 01/29/2016 2:37:40  PM      MDM   Final diagnoses:  Chest pain, unspecified chest pain type    Patient with chest pain. Upper chest and is reproducible. Has not been associated with exertion. EKG reassuring. Enzymes negative. Will discharge home to follow-up with her primary care doctor.    Benjiman Core, MD 01/29/16 2153

## 2019-04-14 ENCOUNTER — Encounter: Payer: Self-pay | Admitting: Family Medicine

## 2019-04-14 ENCOUNTER — Ambulatory Visit: Payer: 59 | Admitting: Family Medicine

## 2019-04-14 ENCOUNTER — Other Ambulatory Visit: Payer: Self-pay

## 2019-04-14 ENCOUNTER — Encounter (INDEPENDENT_AMBULATORY_CARE_PROVIDER_SITE_OTHER): Payer: Self-pay

## 2019-04-14 VITALS — BP 122/80 | HR 85 | Temp 98.1°F | Resp 12 | Ht 62.5 in | Wt 160.0 lb

## 2019-04-14 DIAGNOSIS — E663 Overweight: Secondary | ICD-10-CM | POA: Diagnosis not present

## 2019-04-14 DIAGNOSIS — E119 Type 2 diabetes mellitus without complications: Secondary | ICD-10-CM

## 2019-04-14 DIAGNOSIS — I10 Essential (primary) hypertension: Secondary | ICD-10-CM | POA: Diagnosis not present

## 2019-04-14 NOTE — Patient Instructions (Addendum)
Thank you for coming into the office today. I appreciate the opportunity to provide you with the care for your health and wellness. Today we discussed: overall health   Follow Up: 2-3 months   Labs today And in a week for fasting cholesterol panel.  We will update results.   Please continue to practice social distancing to keep you, your family, and our community safe.  If you must go out, please wear a Mask and practice good handwashing.  [place] YOUR HANDS WELL AND FREQUENTLY. AVOID TOUCHING YOUR FACE, UNLESS YOUR HANDS ARE FRESHLY WASHED.  GET FRESH AIR DAILY. STAY HYDRATED WITH WATER.   It was a pleasure to see you and I look forward to continuing to work together on your health and well-being. Please do not hesitate to call the office if you need care or have questions about your care.  Have a wonderful day and week.  With Gratitude,  Jennifer Beach, DNP, AGNP-BC  [facility]  Walking is a great form of exercise to increase your strength, endurance and overall fitness.  A walking program can help you start slowly and gradually build endurance as you go.  Everyone's ability is different, so each person's starting point will be different.  You do not have to follow them exactly.  The are just samples. You should simply find out what's right for you and stick to that program.   In the beginning, you'll start off walking 2-3 times a day for short distances.  As you get stronger, you'll be walking further at just 1-2 times per day.  A. You Can Walk For A Certain Length Of Time Each Day    Walk 5 minutes 3 times per day.  Increase 2 minutes every 2 days (3 times per day).  Work up to 25-30 minutes (1-2 times per day).   Example:   Day 1-2 5 minutes 3 times per day   Day 7-8 12 minutes 2-3 times per day   Day 13-14 25 minutes 1-2 times per day  B. You Can Walk For a Certain Distance Each Day     Distance can be substituted for time.    Example:   3 trips to mailbox (at  road)   3 trips to corner of block   3 trips around the block  C. Go to local high school and use the track.     Calorie Counting for Weight Loss Calories are units of energy. Your body needs a certain amount of calories from food to keep you going throughout the day. When you eat more calories than your body needs, your body stores the extra calories as fat. When you eat fewer calories than your body needs, your body burns fat to get the energy it needs. Calorie counting means keeping track of how many calories you eat and drink each day. Calorie counting can be helpful if you need to lose weight. If you make sure to eat fewer calories than your body needs, you should lose weight. Ask your health care provider what a healthy weight is for you. For calorie counting to work, you will need to eat the right number of calories in a day in order to lose a healthy amount of weight per week. A dietitian can help you determine how many calories you need in a day and will give you suggestions on how to reach your calorie goal.  A healthy amount of weight to lose per week is usually 1-2 lb (0.5-0.9  kg). This usually means that your daily calorie intake should be reduced by 500-750 calories.  Eating 1,200 - 1,500 calories per day can help most women lose weight.  Eating 1,500 - 1,800 calories per day can help most men lose weight. What do I need to know about calorie counting? In order to meet your daily calorie goal, you will need to:  Find out how many calories are in each food you would like to eat. Try to do this before you eat.  Decide how much of the food you plan to eat.  Write down what you ate and how many calories it had. Doing this is called keeping a food log. To successfully lose weight, it is important to balance calorie counting with a healthy lifestyle that includes regular activity. Aim for 150 minutes of moderate exercise (such as walking) or 75 minutes of vigorous exercise (such as  running) each week. Where do I find calorie information?  The number of calories in a food can be found on a Nutrition Facts label. If a food does not have a Nutrition Facts label, try to look up the calories online or ask your dietitian for help. Remember that calories are listed per serving. If you choose to have more than one serving of a food, you will have to multiply the calories per serving by the amount of servings you plan to eat. For example, the label on a package of bread might say that a serving size is 1 slice and that there are 90 calories in a serving. If you eat 1 slice, you will have eaten 90 calories. If you eat 2 slices, you will have eaten 180 calories. How do I keep a food log? Immediately after each meal, record the following information in your food log:  What you ate. Don't forget to include toppings, sauces, and other extras on the food.  How much you ate. This can be measured in cups, ounces, or number of items.  How many calories each food and drink had.  The total number of calories in the meal. Keep your food log near you, such as in a small notebook in your pocket, or use a mobile app or website. Some programs will calculate calories for you and show you how many calories you have left for the day to meet your goal. What are some calorie counting tips?   Use your calories on foods and drinks that will fill you up and not leave you hungry: ? Some examples of foods that fill you up are nuts and nut butters, vegetables, lean proteins, and high-fiber foods like whole grains. High-fiber foods are foods with more than 5 g fiber per serving. ? Drinks such as sodas, specialty coffee drinks, alcohol, and juices have a lot of calories, yet do not fill you up.  Eat nutritious foods and avoid empty calories. Empty calories are calories you get from foods or beverages that do not have many vitamins or protein, such as candy, sweets, and soda. It is better to have a nutritious  high-calorie food (such as an avocado) than a food with few nutrients (such as a bag of chips).  Know how many calories are in the foods you eat most often. This will help you calculate calorie counts faster.  Pay attention to calories in drinks. Low-calorie drinks include water and unsweetened drinks.  Pay attention to nutrition labels for "low fat" or "fat free" foods. These foods sometimes have the same amount of calories  or more calories than the full fat versions. They also often have added sugar, starch, or salt, to make up for flavor that was removed with the fat.  Find a way of tracking calories that works for you. Get creative. Try different apps or programs if writing down calories does not work for you. What are some portion control tips?  Know how many calories are in a serving. This will help you know how many servings of a certain food you can have.  Use a measuring cup to measure serving sizes. You could also try weighing out portions on a kitchen scale. With time, you will be able to estimate serving sizes for some foods.  Take some time to put servings of different foods on your favorite plates, bowls, and cups so you know what a serving looks like.  Try not to eat straight from a bag or box. Doing this can lead to overeating. Put the amount you would like to eat in a cup or on a plate to make sure you are eating the right portion.  Use smaller plates, glasses, and bowls to prevent overeating.  Try not to multitask (for example, watch TV or use your computer) while eating. If it is time to eat, sit down at a table and enjoy your food. This will help you to know when you are full. It will also help you to be aware of what you are eating and how much you are eating. What are tips for following this plan? Reading food labels  Check the calorie count compared to the serving size. The serving size may be smaller than what you are used to eating.  Check the source of the  calories. Make sure the food you are eating is high in vitamins and protein and low in saturated and trans fats. Shopping  Read nutrition labels while you shop. This will help you make healthy decisions before you decide to purchase your food.  Make a grocery list and stick to it. Cooking  Try to cook your favorite foods in a healthier way. For example, try baking instead of frying.  Use low-fat dairy products. Meal planning  Use more fruits and vegetables. Half of your plate should be fruits and vegetables.  Include lean proteins like poultry and fish. How do I count calories when eating out?  Ask for smaller portion sizes.  Consider sharing an entree and sides instead of getting your own entree.  If you get your own entree, eat only half. Ask for a box at the beginning of your meal and put the rest of your entree in it so you are not tempted to eat it.  If calories are listed on the menu, choose the lower calorie options.  Choose dishes that include vegetables, fruits, whole grains, low-fat dairy products, and lean protein.  Choose items that are boiled, broiled, grilled, or steamed. Stay away from items that are buttered, battered, fried, or served with cream sauce. Items labeled "crispy" are usually fried, unless stated otherwise.  Choose water, low-fat milk, unsweetened iced tea, or other drinks without added sugar. If you want an alcoholic beverage, choose a lower calorie option such as a glass of wine or light beer.  Ask for dressings, sauces, and syrups on the side. These are usually high in calories, so you should limit the amount you eat.  If you want a salad, choose a garden salad and ask for grilled meats. Avoid extra toppings like bacon, cheese, or fried  items. Ask for the dressing on the side, or ask for olive oil and vinegar or lemon to use as dressing.  Estimate how many servings of a food you are given. For example, a serving of cooked rice is  cup or about the  size of half a baseball. Knowing serving sizes will help you be aware of how much food you are eating at restaurants. The list below tells you how big or small some common portion sizes are based on everyday objects: ? 1 oz--4 stacked dice. ? 3 oz--1 deck of cards. ? 1 tsp--1 die. ? 1 Tbsp-- a ping-pong ball. ? 2 Tbsp--1 ping-pong ball. ?  cup-- baseball. ? 1 cup--1 baseball. Summary  Calorie counting means keeping track of how many calories you eat and drink each day. If you eat fewer calories than your body needs, you should lose weight.  A healthy amount of weight to lose per week is usually 1-2 lb (0.5-0.9 kg). This usually means reducing your daily calorie intake by 500-750 calories.  The number of calories in a food can be found on a Nutrition Facts label. If a food does not have a Nutrition Facts label, try to look up the calories online or ask your dietitian for help.  Use your calories on foods and drinks that will fill you up, and not on foods and drinks that will leave you hungry.  Use smaller plates, glasses, and bowls to prevent overeating. This information is not intended to replace advice given to you by your health care provider. Make sure you discuss any questions you have with your health care provider. Document Released: 09/30/2005 Document Revised: 06/19/2018 Document Reviewed: 08/30/2016 Elsevier Patient Education  2020 [employer]

## 2019-04-14 NOTE — Progress Notes (Signed)
Subjective:     Patient ID: Jennifer Velasquez, female   DOB: 07/06/1956, 63 y.o.   MRN: [id number]  Jennifer Velasquez presents for New Patient (Initial Visit) (establish care) Jennifer Velasquez is a 63 year old female patient who was previously seen back in 2013 by Dr. Lodema HongSimpson.  She is here to reestablish care. Has a significant history of hypertension, constipation, type 2 diabetes, upper lipidemia, neck and back pain.  Has had a total hysterectomy, appendectomy, some dental surgery. Significant family history of cancer, hypertension, TIAs.  Is a former smoker.  Reports occasional use of alcohol.  Denies use of drugs.  Reports not being currently sexually active.  Health maintenance wise unsure of when her last Pap was, unsure of when her last vaccines were as well.  Tetanus not recorded.  Pneumonia is not recorded.  Last mammogram was 2016, last colonoscopy was 2010.  Last diabetic foot exam was 2011.  Has not seen an eye doctor or dentist.  Reports not taking any medications at this time.  Reports that she does walk daily.  And likes to jump rope.  Lives with her daughter Jennifer Velasquez and granddaughter Jennifer Velasquez.  Has a son that lives in [place].  He has 2 grandchildren as well.  Enjoys spending time with Jennifer Velasquez and reading books and enjoys spending time with her friends.  Reports that she eats a lot of bread, pasta veggies and loves bacon.  Reports that she does drink frozen coffee daily and daily soda.  Reports 3 bottles of water daily.  Reports she wears her seatbelt does not wear sunscreen.  Has smoke detectors.  Reports she does not use her phone while driving.  Today she has no complaints but she would just like to follow-up and get established with care.  And get all everything checked up.  Today patient denies signs and symptoms of COVID 19 infection including fever, chills, cough, shortness of breath, and headache.  Past Medical, Surgical, Social History, Allergies, and Medications have been Reviewed.    Past Medical History:  Diagnosis Date  . Back pain   . Neck pain    left   . Need for prophylactic hormone replacement therapy (postmenopausal)   . Obesity, unspecified   . Other and unspecified hyperlipidemia   . Routine general medical examination at a health care facility   . Trichomonal vaginitis   . Type II or unspecified type diabetes mellitus without mention of complication, not stated as uncontrolled   . Unspecified constipation   . Unspecified essential hypertension    Past Surgical History:  Procedure Laterality Date  . APPENDECTOMY    . DENTAL SURGERY    . TOTAL ABDOMINAL HYSTERECTOMY     fibroids   Social History   Socioeconomic History  . Marital status: Single    Spouse name: Not on file  . Number of children: 2  . Years of education: 12th  . Highest education level: Not on file  Occupational History  . 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 Financial resource strain: Not on file  . Food insecurity    Worry: Not on file    Inability: Not on file  . Transportation needs    Medical: Not on file    Non-medical: Not on file  Tobacco Use  . Smoking status: Former Games developermoker  . Smokeless tobacco: Never Used  Substance and Sexual Activity  . Alcohol use: Yes  . Drug use: No  . Sexual activity:  Not on file  Lifestyle  . Physical activity    Days per week: Not on file    Minutes per session: Not on file  . Stress: Not on file  Relationships  . Social Herbalist on phone: Not on file    Gets together: Not on file    Attends religious service: Not on file    Active member of club or organization: Not on file    Attends meetings of clubs or organizations: Not on file    Relationship status: Not on file  . Intimate partner violence    Fear of current or ex partner: Not on file    Emotionally abused: Not on file    Physically abused: Not on file    Forced sexual activity: Not on file  Other Topics Concern  . Not on file  Social  History Narrative   Pt lives with daughter and granddaughter    Outpatient Encounter Medications as of 04/14/2019  Medication Sig  . aspirin (ASPIRIN LOW DOSE) 81 MG EC tablet Take 81-243 mg by mouth daily as needed for pain.   . [DISCONTINUED] DAILY MULTIPLE VITAMINS PO Take by mouth daily.     No facility-administered encounter medications on file as of 04/14/2019.    Allergies  Allergen Reactions  . Pravastatin Sodium     REACTION: sore throat    Review of Systems  Constitutional: Negative for activity change, appetite change, chills and fever.  HENT: Negative.   Eyes: Negative.  Negative for visual disturbance.  Respiratory: Negative.  Negative for cough and shortness of breath.   Cardiovascular: Negative.   Gastrointestinal: Negative.   Endocrine: Negative.   Genitourinary: Negative.   Musculoskeletal: Negative.   Skin: Negative.   Allergic/Immunologic: Negative.   Neurological: Negative.  Negative for dizziness and headaches.  Hematological: Negative.   Psychiatric/Behavioral: Negative.   All other systems reviewed and are negative.      Objective:     BP 122/80   Pulse 85   Temp 98.1 F (36.7 C) (Oral)   Resp 12   Ht 5' 2.5" (1.588 m)   Wt 160 lb (72.6 kg)   SpO2 98%   BMI 28.80 kg/m   Physical Exam Vitals signs and nursing note reviewed.  Constitutional:      Appearance: Normal appearance. She is well-developed, well-groomed and overweight.  HENT:     Head: Normocephalic and atraumatic.     Right Ear: External ear normal.     Left Ear: External ear normal.     Nose: Nose normal.  Eyes:     General:        Right eye: No discharge.        Left eye: No discharge.     Conjunctiva/sclera: Conjunctivae normal.  Neck:     Musculoskeletal: Normal range of motion and neck supple.  Cardiovascular:     Rate and Rhythm: Normal rate and regular rhythm.     Pulses: Normal pulses.     Heart sounds: Normal heart sounds.  Pulmonary:     Effort: Pulmonary  effort is normal.     Breath sounds: Normal breath sounds.  Abdominal:     General: Abdomen is flat. Bowel sounds are normal.     Palpations: Abdomen is soft.  Musculoskeletal: Normal range of motion.  Skin:    General: Skin is warm.     Capillary Refill: Capillary refill takes less than 2 seconds.  Neurological:     General:  No focal deficit present.     Mental Status: She is alert and oriented to person, place, and time.  Psychiatric:        Attention and Perception: Attention and perception normal.        Mood and Affect: Mood and affect normal.        Speech: Speech normal.        Behavior: Behavior normal. Behavior is cooperative.        Thought Content: Thought content normal.        Cognition and Memory: Cognition and memory normal.        Judgment: Judgment normal.         Assessment and Plan        1. Essential hypertension Encouraged her to avoid caffeine, continue on smoking sensation that she has done, work on exercising more frequently.  And working on calorie counting for better weight management.  We will be assessing her labs.  - CBC with Differential/Platelet  2. Diabetes mellitus without complication (HCC) Encourage compliance with calorie counting and a proper diabetic diet as well as exercise. Needs foot assess as well as urine microalbumin  - COMPLETE METABOLIC PANEL WITH GFR - Hemoglobin A1c  3. Overweight (BMI 25.0-29.9) Encourage walking and diet.  Provided with calorie counting and walking written information in AVS today.  We will follow-up in 3 months for weight check.   Follow Up: 3 months flu shot and wt check    Freddy FinnerHannah M. Tiffanie Blassingame, DNP, AGNP-BC [facility] [address], [address] [place], [place] [address] Office Hours: Mon-Thurs 8 am-5 pm; Fri 8 am-12 pm Office Phone:  [phone]  Office Fax: [phone]

## 2019-04-15 LAB — HEMOGLOBIN A1C: Hemoglobin A1C: >14

## 2019-04-16 LAB — CBC WITH DIFFERENTIAL/PLATELET
Absolute Monocytes: 317 cells/uL (ref 200–950)
Basophils Absolute: 29 cells/uL (ref 0–200)
Basophils Relative: 0.6 %
Eosinophils Absolute: 101 cells/uL (ref 15–500)
Eosinophils Relative: 2.1 %
HCT: 42.2 % (ref 35.0–45.0)
Hemoglobin: 14.1 g/dL (ref 11.7–15.5)
Lymphs Abs: 2414 cells/uL (ref 850–3900)
MCH: 28.7 pg (ref 27.0–33.0)
MCHC: 33.4 g/dL (ref 32.0–36.0)
MCV: 85.9 fL (ref 80.0–100.0)
MPV: 10.6 fL (ref 7.5–12.5)
Monocytes Relative: 6.6 %
Neutro Abs: 1939 cells/uL (ref 1500–7800)
Neutrophils Relative %: 40.4 %
Platelets: 300 10*3/uL (ref 140–400)
RBC: 4.91 10*6/uL (ref 3.80–5.10)
RDW: 12.6 % (ref 11.0–15.0)
Total Lymphocyte: 50.3 %
WBC: 4.8 10*3/uL (ref 3.8–10.8)

## 2019-04-16 LAB — COMPLETE METABOLIC PANEL WITH GFR
AG Ratio: 1.4 (calc) (ref 1.0–2.5)
ALT: 11 U/L (ref 6–29)
AST: 13 U/L (ref 10–35)
Albumin: 4.1 g/dL (ref 3.6–5.1)
Alkaline phosphatase (APISO): 97 U/L (ref 37–153)
BUN: 10 mg/dL (ref 7–25)
CO2: 28 mmol/L (ref 20–32)
Calcium: 9.5 mg/dL (ref 8.6–10.4)
Chloride: 99 mmol/L (ref 98–110)
Creat: 0.74 mg/dL (ref 0.50–0.99)
GFR, Est African American: 100 mL/min/{1.73_m2} (ref >=60)
GFR, Est Non African American: 86 mL/min/{1.73_m2} (ref >=60)
Globulin: 2.9 g/dL (calc) (ref 1.9–3.7)
Glucose, Bld: 314 mg/dL — ABNORMAL HIGH (ref 65–99)
Potassium: 4.1 mmol/L (ref 3.5–5.3)
Sodium: 134 mmol/L — ABNORMAL LOW (ref 135–146)
Total Bilirubin: 0.7 mg/dL (ref 0.2–1.2)
Total Protein: 7 g/dL (ref 6.1–8.1)

## 2019-04-21 ENCOUNTER — Telehealth: Payer: Self-pay | Admitting: *Deleted

## 2019-04-21 NOTE — Telephone Encounter (Signed)
Pt called requesting a call back about the labs that she had done last week

## 2019-04-22 ENCOUNTER — Other Ambulatory Visit: Payer: Self-pay | Admitting: Family Medicine

## 2019-04-22 NOTE — Telephone Encounter (Signed)
Returned patients call. No answer, left generic message requesting call back.

## 2019-04-22 NOTE — Telephone Encounter (Signed)
Per Jarrett Soho, waiting on A1C to come back. All other labs normal.

## 2019-04-27 ENCOUNTER — Telehealth: Payer: Self-pay | Admitting: Family Medicine

## 2019-04-27 NOTE — Telephone Encounter (Signed)
Tried to reach patient no answer nor voicemail

## 2019-04-27 NOTE — Telephone Encounter (Signed)
Please call the pt back regarding labs  

## 2019-04-30 ENCOUNTER — Ambulatory Visit: Payer: 59 | Admitting: Family Medicine

## 2019-04-30 ENCOUNTER — Encounter: Payer: Self-pay | Admitting: Family Medicine

## 2019-04-30 ENCOUNTER — Other Ambulatory Visit: Payer: Self-pay

## 2019-04-30 VITALS — BP 138/88 | HR 93 | Temp 98.8°F | Resp 15 | Ht 62.5 in | Wt 158.0 lb

## 2019-04-30 DIAGNOSIS — E119 Type 2 diabetes mellitus without complications: Secondary | ICD-10-CM

## 2019-04-30 DIAGNOSIS — E663 Overweight: Secondary | ICD-10-CM

## 2019-04-30 NOTE — Patient Instructions (Signed)
Thank you for coming into the office today. I appreciate the opportunity to provide you with the care for your health and wellness. Today we discussed: diabetes  Follow Up: 3 weeks in office, bring meter  (3 months CPE-eyes and foot exam, no pap)  No labs today  If you need referral for Eye dr let us know.  Please find out when you last had tetanus vaccine.   Check your blood sugars as written on your blood sugar booklet.  Please continue to practice social distancing to keep you, your family, and our community safe.  If you must go out, please wear a Mask and practice good handwashing.  [place] YOUR HANDS WELL AND FREQUENTLY. AVOID TOUCHING YOUR FACE, UNLESS YOUR HANDS ARE FRESHLY WASHED.  GET FRESH AIR DAILY. STAY HYDRATED WITH WATER.   It was a pleasure to see you and I look forward to continuing to work together on your health and well-being. Please do not hesitate to call the office if you need care or have questions about your care.  Have a wonderful day and week.  With Gratitude,  Cherly Beach, DNP, AGNP-BC   Carbohydrate Counting for Diabetes Mellitus, Adult  Carbohydrate counting is a method of keeping track of how many carbohydrates you eat. Eating carbohydrates naturally increases the amount of sugar (glucose) in the blood. Counting how many carbohydrates you eat helps keep your blood glucose within normal limits, which helps you manage your diabetes (diabetes mellitus). It is important to know how many carbohydrates you can safely have in each meal. This is different for every person. A diet and nutrition specialist (registered dietitian) can help you make a meal plan and calculate how many carbohydrates you should have at each meal and snack. Carbohydrates are found in the following foods:  Grains, such as breads and cereals.  Dried beans and soy products.  Starchy vegetables, such as potatoes, peas, and corn.  Fruit and fruit juices.  Milk and yogurt.   Sweets and snack foods, such as cake, cookies, candy, chips, and soft drinks. How do I count carbohydrates? There are two ways to count carbohydrates in food. You can use either of the methods or a combination of both. Reading "Nutrition Facts" on packaged food The "Nutrition Facts" list is included on the labels of almost all packaged foods and beverages in the [place] It includes:  The serving size.  Information about nutrients in each serving, including the grams (g) of carbohydrate per serving. To use the "Nutrition Facts":  Decide how many servings you will have.  Multiply the number of servings by the number of carbohydrates per serving.  The resulting number is the total amount of carbohydrates that you will be having. Learning standard serving sizes of other foods When you eat carbohydrate foods that are not packaged or do not include "Nutrition Facts" on the label, you need to measure the servings in order to count the amount of carbohydrates:  Measure the foods that you will eat with a food scale or measuring cup, if needed.  Decide how many standard-size servings you will eat.  Multiply the number of servings by 15. Most carbohydrate-rich foods have about 15 g of carbohydrates per serving. ? For example, if you eat 8 oz (170 g) of strawberries, you will have eaten 2 servings and 30 g of carbohydrates (2 servings x 15 g = 30 g).  For foods that have more than one food mixed, such as soups and casseroles, you must  count the carbohydrates in each food that is included. The following list contains standard serving sizes of common carbohydrate-rich foods. Each of these servings has about 15 g of carbohydrates:   hamburger bun or  English muffin.   oz (15 mL) syrup.   oz (14 g) jelly.  1 slice of bread.  1 six-inch tortilla.  3 oz (85 g) cooked rice or pasta.  4 oz (113 g) cooked dried beans.  4 oz (113 g) starchy vegetable, such as peas, corn, or potatoes.  4 oz  (113 g) hot cereal.  4 oz (113 g) mashed potatoes or  of a large baked potato.  4 oz (113 g) canned or frozen fruit.  4 oz (120 mL) fruit juice.  4-6 crackers.  6 chicken nuggets.  6 oz (170 g) unsweetened dry cereal.  6 oz (170 g) plain fat-free yogurt or yogurt sweetened with artificial sweeteners.  8 oz (240 mL) milk.  8 oz (170 g) fresh fruit or one small piece of fruit.  24 oz (680 g) popped popcorn. Example of carbohydrate counting Sample meal  3 oz (85 g) chicken breast.  6 oz (170 g) brown rice.  4 oz (113 g) corn.  8 oz (240 mL) milk.  8 oz (170 g) strawberries with sugar-free whipped topping. Carbohydrate calculation 1. Identify the foods that contain carbohydrates: ? Rice. ? Corn. ? Milk. ? Strawberries. 2. Calculate how many servings you have of each food: ? 2 servings rice. ? 1 serving corn. ? 1 serving milk. ? 1 serving strawberries. 3. Multiply each number of servings by 15 g: ? 2 servings rice x 15 g = 30 g. ? 1 serving corn x 15 g = 15 g. ? 1 serving milk x 15 g = 15 g. ? 1 serving strawberries x 15 g = 15 g. 4. Add together all of the amounts to find the total grams of carbohydrates eaten: ? 30 g + 15 g + 15 g + 15 g = 75 g of carbohydrates total. Summary  Carbohydrate counting is a method of keeping track of how many carbohydrates you eat.  Eating carbohydrates naturally increases the amount of sugar (glucose) in the blood.  Counting how many carbohydrates you eat helps keep your blood glucose within normal limits, which helps you manage your diabetes.  A diet and nutrition specialist (registered dietitian) can help you make a meal plan and calculate how many carbohydrates you should have at each meal and snack. This information is not intended to replace advice given to you by your health care provider. Make sure you discuss any questions you have with your health care provider. Document Released: 09/30/2005 Document Revised:  04/24/2017 Document Reviewed: 03/13/2016 Elsevier Patient Education  2020 [employer]

## 2019-04-30 NOTE — Progress Notes (Signed)
Subjective:     Patient ID: Jennifer Velasquez, female   DOB: 01/22/1956, 63 y.o.   MRN: [id number]  Jennifer BatonMona F Sparano presents for Diabetes (Diabetic ed and new start insulin. )  Here today for education on how to check her blood sugars. First time doing this. She has meter with her. Teach back method was provided. She demonstrated and verbalized how to check sugar and was provided with booklet and information  On diet and blood sugar checking.   Today patient denies signs and symptoms of COVID 19 infection including fever, chills, cough, shortness of breath, and headache.   Past Medical, Surgical, Social History, Allergies, and Medications have been Reviewed.   Past Medical History:  Diagnosis Date  . Back pain   . Neck pain    left   . Need for prophylactic hormone replacement therapy (postmenopausal)   . Obesity, unspecified   . Other and unspecified hyperlipidemia   . Routine general medical examination at a health care facility   . Trichomonal vaginitis   . Type II or unspecified type diabetes mellitus without mention of complication, not stated as uncontrolled   . Unspecified constipation   . Unspecified essential hypertension    Past Surgical History:  Procedure Laterality Date  . APPENDECTOMY    . DENTAL SURGERY    . TOTAL ABDOMINAL HYSTERECTOMY     fibroids   Social History   Socioeconomic History  . Marital status: Single    Spouse name: Not on file  . Number of children: 2  . Years of education: 12th  . Highest education level: Not on file  Occupational History  . 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 Financial resource strain: Not hard at all  . Food insecurity    Worry: Never true    Inability: Never true  . Transportation needs    Medical: No    Non-medical: No  Tobacco Use  . Smoking status: Former Games developermoker  . Smokeless tobacco: Never Used  Substance and Sexual Activity  . Alcohol use: Yes  . Drug use: No  . Sexual activity: Not  Currently  Lifestyle  . Physical activity    Days per week: 3 days    Minutes per session: 30 min  . Stress: Not at all  Relationships  . Social connections    Talks on phone: More than three times a week    Gets together: More than three times a week    Attends religious service: More than 4 times per year    Active member of club or organization: No    Attends meetings of clubs or organizations: Never    Relationship status: Never married  . Intimate partner violence    Fear of current or ex partner: No    Emotionally abused: No    Physically abused: No    Forced sexual activity: No  Other Topics Concern  . Not on file  Social History Narrative   Lives with daughter Randa Evens(Jacklyn) and granddaughter Lequita Halt(Morgan)   Son (antwan- lives in [place]) 2 grand babies (boys)      Enjoys spends time with [name], reads books, spends time with friend      Diet: bread, pasta, veggies, bacon    Caffeine: frozen coffee, soda -daily   Water: 3 bottles of water      Wears seat belt   Doesn't not wear sunscreen   Smoke detectors   Report not using phone while driving  No outpatient encounter medications on file as of 04/30/2019.   No facility-administered encounter medications on file as of 04/30/2019.    Allergies  Allergen Reactions  . Pravastatin Sodium     REACTION: sore throat    Review of Systems  Constitutional: Negative.   HENT: Negative.   Eyes: Negative.   Respiratory: Negative.   Cardiovascular: Negative.   Gastrointestinal: Negative.   Endocrine: Negative.   Genitourinary: Negative.   Musculoskeletal: Negative.   Skin: Negative.   Allergic/Immunologic: Negative.   Neurological: Negative.   Hematological: Negative.   Psychiatric/Behavioral: Negative.   All other systems reviewed and are negative.      Objective:     BP 138/88   Pulse 93   Temp 98.8 F (37.1 C) (Temporal)   Resp 15   Ht 5' 2.5" (1.588 m)   Wt 158 lb (71.7 kg)   SpO2 98%   BMI 28.44 kg/m     Physical Exam Vitals signs and nursing note reviewed.  Constitutional:      Appearance: Normal appearance. She is well-developed, well-groomed and overweight.  HENT:     Head: Normocephalic and atraumatic.     Right Ear: External ear normal.     Left Ear: External ear normal.     Nose: Nose normal.  Eyes:     General:        Right eye: No discharge.        Left eye: No discharge.     Conjunctiva/sclera: Conjunctivae normal.  Neck:     Musculoskeletal: Normal range of motion and neck supple.  Cardiovascular:     Rate and Rhythm: Normal rate and regular rhythm.     Pulses: Normal pulses.     Heart sounds: Normal heart sounds.  Pulmonary:     Effort: Pulmonary effort is normal.     Breath sounds: Normal breath sounds.  Abdominal:     General: Abdomen is flat. Bowel sounds are normal.     Palpations: Abdomen is soft.  Musculoskeletal: Normal range of motion.  Skin:    General: Skin is warm.     Capillary Refill: Capillary refill takes less than 2 seconds.  Neurological:     General: No focal deficit present.     Mental Status: She is alert and oriented to person, place, and time.  Psychiatric:        Attention and Perception: Attention and perception normal.        Mood and Affect: Anixous        Speech: Speech normal.        Behavior: Behavior normal. Behavior is cooperative.        Thought Content: Thought content normal.        Cognition and Memory: Cognition and memory normal.        Judgment: Judgment normal.      Assessment and Plan        1. Diabetes mellitus without complication (HCC) Uncontrolled A1c 14%. Refuses insulin. Willing to start checking sugar at home and start oral agents. Started on Metformin and Glipizide Will bring back in 3 weeks for assessment. She is aware that if A1c does not improve that insulin will be the only option to prevent other problems. Needs to be started on a statin as well, has allergy to pravastatin-sore throat. Additionally  needs urine micro, foot exam, and eye exam.  Extensive education about blood sugar checking performed in the office. Teach back method. She demonstrated and verbalized how to check  sugar and was provided with booklet and information  On diet and blood sugar checking.    Reviewed side effects, risks and benefits of medication.   Patient acknowledged agreement and understanding of the plan.    2. Overweight (BMI 25.0-29.9) Educated on diet and exercise measures to help with reduction in weight which will help with overall health. Patient acknowledged agreement and understanding of the plan.    Return in about 3 weeks (around 05/21/2019) for w meter.        Freddy FinnerHannah M. Dal Blew, DNP, AGNP-BC [facility] [address], [address] [place], [place] [address] Office Hours: Mon-Thurs 8 am-5 pm; Fri 8 am-12 pm Office Phone:  [phone]  Office Fax: [phone]

## 2019-05-03 ENCOUNTER — Other Ambulatory Visit: Payer: Self-pay | Admitting: Family Medicine

## 2019-05-03 DIAGNOSIS — E119 Type 2 diabetes mellitus without complications: Secondary | ICD-10-CM

## 2019-05-03 MED ORDER — METFORMIN HCL 500 MG PO TABS: 500.0000 mg | ORAL_TABLET | Freq: Two times a day (BID) | ORAL | 1 refills | Status: DC

## 2019-05-03 MED ORDER — GLIPIZIDE ER 5 MG PO TB24: 5.0000 mg | ORAL_TABLET | Freq: Every day | ORAL | 1 refills | Status: DC

## 2019-05-04 NOTE — Telephone Encounter (Signed)
Just checking to see if one of you  reached out to the patient   If not can you

## 2019-05-04 NOTE — Telephone Encounter (Signed)
Yes. This has been addressed

## 2019-05-21 ENCOUNTER — Encounter (INDEPENDENT_AMBULATORY_CARE_PROVIDER_SITE_OTHER): Payer: Self-pay

## 2019-05-21 ENCOUNTER — Other Ambulatory Visit: Payer: Self-pay

## 2019-05-21 ENCOUNTER — Encounter: Payer: Self-pay | Admitting: Family Medicine

## 2019-05-21 ENCOUNTER — Ambulatory Visit: Payer: 59 | Admitting: Family Medicine

## 2019-05-21 VITALS — BP 140/88 | HR 78 | Temp 97.6°F | Resp 12 | Ht 62.0 in | Wt 161.1 lb

## 2019-05-21 DIAGNOSIS — I10 Essential (primary) hypertension: Secondary | ICD-10-CM | POA: Diagnosis not present

## 2019-05-21 DIAGNOSIS — E785 Hyperlipidemia, unspecified: Secondary | ICD-10-CM | POA: Diagnosis not present

## 2019-05-21 DIAGNOSIS — E119 Type 2 diabetes mellitus without complications: Secondary | ICD-10-CM | POA: Diagnosis not present

## 2019-05-21 DIAGNOSIS — E1169 Type 2 diabetes mellitus with other specified complication: Secondary | ICD-10-CM

## 2019-05-21 NOTE — Progress Notes (Signed)
Subjective:     Patient ID: Jennifer Velasquez, female   DOB: 12/26/1955, 63 y.o.   MRN: [id number]  Jennifer Velasquez presents for   Diabetes follow-up:  Blood sugars at home are running average 150-200's but getting better. This morning she reports 118.  Denies hypoglycemia.  Denies polydipsia and polyuria.  Last eye exam was needs to get schedule.  Last foot exam in office was today.  Last A1c 14%  Denies non-healing wounds or rashes. Denies signs of UTI or other infections. Patient follows a low sugar diet and checks feet regularly without concerns. Reports staying hydrated by drinking water.    Trying to eat less sugar and bread. Has stopped drinking her frappes from [employer].  Needs eye doctor, urine microalbumin checked this year.  Today patient denies signs and symptoms of COVID 19 infection including fever, chills, cough, shortness of breath, and headache.  Past Medical, Surgical, Social History, Allergies, and Medications have been Reviewed.  Past Medical History:  Diagnosis Date  . Back pain   . Neck pain    left   . Need for prophylactic hormone replacement therapy (postmenopausal)   . Obesity, unspecified   . Other and unspecified hyperlipidemia   . Routine general medical examination at a health care facility   . Trichomonal vaginitis   . Type II or unspecified type diabetes mellitus without mention of complication, not stated as uncontrolled   . Unspecified constipation   . Unspecified essential hypertension    Past Surgical History:  Procedure Laterality Date  . APPENDECTOMY    . DENTAL SURGERY    . TOTAL ABDOMINAL HYSTERECTOMY     fibroids   Social History   Socioeconomic History  . Marital status: Single    Spouse name: Not on file  . Number of children: 2  . Years of education: 12th  . Highest education level: Not on file  Occupational History  . 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 Financial resource strain: Not hard at all  . Food insecurity     Worry: Never true    Inability: Never true  . Transportation needs    Medical: No    Non-medical: No  Tobacco Use  . Smoking status: Former Games developermoker  . Smokeless tobacco: Never Used  Substance and Sexual Activity  . Alcohol use: Yes  . Drug use: No  . Sexual activity: Not Currently  Lifestyle  . Physical activity    Days per week: 3 days    Minutes per session: 30 min  . Stress: Not at all  Relationships  . Social connections    Talks on phone: More than three times a week    Gets together: More than three times a week    Attends religious service: More than 4 times per year    Active member of club or organization: No    Attends meetings of clubs or organizations: Never    Relationship status: Never married  . Intimate partner violence    Fear of current or ex partner: No    Emotionally abused: No    Physically abused: No    Forced sexual activity: No  Other Topics Concern  . Not on file  Social History Narrative   Lives with daughter Jennifer Velasquez(Jennifer Velasquez) and granddaughter Jennifer Velasquez(Jennifer Velasquez)   Son (Jennifer Velasquez- lives in [place]) 2 grand babies (boys)      Enjoys spends time with Jennifer HaltMorgan, reads books, spends time with friend  Diet: bread, pasta, veggies, bacon    Caffeine: frozen coffee, soda -daily   Water: 3 bottles of water      Wears seat belt   Doesn't not wear sunscreen   Smoke detectors   Report not using phone while driving     Outpatient Encounter Medications as of 05/21/2019  Medication Sig  . glipiZIDE (GLUCOTROL XL) 5 MG 24 hr tablet Take 1 tablet (5 mg total) by mouth daily with breakfast.  . metFORMIN (GLUCOPHAGE) 500 MG tablet Take 1 tablet (500 mg total) by mouth 2 (two) times daily with a meal.   No facility-administered encounter medications on file as of 05/21/2019.    Allergies  Allergen Reactions  . Pravastatin Sodium     REACTION: sore throat    Review of Systems  Constitutional: Negative for chills and fever.  HENT: Negative.   Eyes: Negative.   Respiratory:  Negative for cough and shortness of breath.   Cardiovascular: Negative.   Gastrointestinal: Negative.   Endocrine: Negative.   Genitourinary: Negative.   Musculoskeletal: Negative.   Skin: Negative.   Allergic/Immunologic: Negative.   Neurological: Negative.   Hematological: Negative.   Psychiatric/Behavioral: Negative.   All other systems reviewed and are negative.      Objective:     BP 140/88   Pulse 78   Temp 97.6 F (36.4 C) (Temporal)   Resp 12   Ht 5\' 2"  (1.575 m)   Wt 161 lb 1.9 oz (73.1 kg)   SpO2 98%   BMI 29.47 kg/m   Physical Exam Vitals signs and nursing note reviewed.  Constitutional:      Appearance: Normal appearance. She is well-developed and well-groomed. She is obese.  HENT:     Head: Normocephalic and atraumatic.     Right Ear: External ear normal.     Left Ear: External ear normal.     Nose: Nose normal.     Mouth/Throat:     Mouth: Mucous membranes are moist.     Pharynx: Oropharynx is clear.  Eyes:     General:        Right eye: No discharge.        Left eye: No discharge.     Conjunctiva/sclera: Conjunctivae normal.  Neck:     Musculoskeletal: Normal range of motion and neck supple.  Cardiovascular:     Rate and Rhythm: Normal rate and regular rhythm.     Pulses: Normal pulses.          Dorsalis pedis pulses are 2+ on the right side and 2+ on the left side.       Posterior tibial pulses are 2+ on the right side and 2+ on the left side.     Heart sounds: Normal heart sounds.  Pulmonary:     Effort: Pulmonary effort is normal.     Breath sounds: Normal breath sounds.  Musculoskeletal: Normal range of motion.  Feet:     Right foot:     Skin integrity: Skin integrity normal.     Toenail Condition: Right toenails are normal.     Left foot:     Skin integrity: Skin integrity normal.     Toenail Condition: Left toenails are normal.  Skin:    General: Skin is warm.     Capillary Refill: Capillary refill takes less than 2 seconds.   Neurological:     General: No focal deficit present.     Mental Status: She is alert and oriented to person, place,  and time.  Psychiatric:        Attention and Perception: Attention normal.        Mood and Affect: Mood normal.        Speech: Speech normal.        Behavior: Behavior normal. Behavior is cooperative.        Thought Content: Thought content normal.        Cognition and Memory: Cognition normal.        Judgment: Judgment normal.    Diabetic Foot Form - Detailed   Diabetic Foot Exam - detailed Diabetic Foot exam was performed with the following findings: Yes 05/21/2019  9:43 AM  Can the patient see the bottom of their feet?: No Are the shoes appropriate in style and fit?: No Is there swelling or and abnormal foot shape?: No Is there a claw toe deformity?: No Is there elevated skin temparature?: No Is there foot or ankle muscle weakness?: No Normal Range of Motion: No Right posterior Tibialias: Present Left posterior Tibialias: Present  Right Dorsalis Pedis: Present Left Dorsalis Pedis: Present  Semmes-Weinstein Monofilament Test R Site 1-Great Toe: Pos L Site 1-Great Toe: Pos             Assessment and Plan       1. Diabetes mellitus without complication [facility]) DENIM START is encouraged to check blood sugar daily as directed. Continue current medications. NEEDS STATIN Educated on importance of maintain a well balanced diabetic friendly diet.  Foot exam performed today. See above.  She is reminded the importance of maintaining  good blood sugars,  taking medications as directed, daily foot care, annual eye exams. Additionally educated about keeping good control over blood pressure and cholesterol as well.  - Microalbumin / creatinine urine ratio  2. Hyperlipidemia associated with type 2 diabetes mellitus ([place]) She is encourage to continue diet changes. I would like to put her on a statin, but will need to check lipids in future, she does not  Want to do  this today. Previous lipid panel was elevated.   3. Essential hypertension SHIRLEY DECAMP is encouraged to maintain a well balanced diet that is low in salt. Controlled, continue current medication regimen. Refills provided   No refills needed.  She is also reminded that exercise is beneficial for heart health and control of  Blood pressure. 30-60 minutes daily is recommended-walking was suggested.    Follow Up: 06/24/2019   Perlie Mayo, DNP, AGNP-BC [address], [address], [place] [id number] Office Hours: Mon-Thurs 8 am-5 pm; Fri 8 am-12 pm Office Phone:  [phone]  Office Fax: [phone]

## 2019-05-21 NOTE — Patient Instructions (Signed)
    Thank you for coming into the office today. I appreciate the opportunity to provide you with the care for your health and wellness. Today we discussed: diabetes  Follow Up: 1 month for DM f/u bring meter  No labs, urine collected   Blood sugar goal: 70-120 fasting If you see under 70 or over 150-200 regularly that is a concern.  Please continue to practice social distancing to keep you, your family, and our community safe.  If you must go out, please wear a Mask and practice good handwashing.  [place] YOUR HANDS WELL AND FREQUENTLY. AVOID TOUCHING YOUR FACE, UNLESS YOUR HANDS ARE FRESHLY WASHED.  GET FRESH AIR DAILY. STAY HYDRATED WITH WATER.   It was a pleasure to see you and I look forward to continuing to work together on your health and well-being. Please do not hesitate to call the office if you need care or have questions about your care.  Have a wonderful day and week. With Gratitude, Cherly Beach, DNP, AGNP-BC

## 2019-06-01 ENCOUNTER — Other Ambulatory Visit: Payer: Self-pay

## 2019-06-01 DIAGNOSIS — E119 Type 2 diabetes mellitus without complications: Secondary | ICD-10-CM

## 2019-06-01 MED ORDER — METFORMIN HCL 500 MG PO TABS: 500.0000 mg | ORAL_TABLET | Freq: Two times a day (BID) | ORAL | 1 refills | Status: DC

## 2019-06-24 ENCOUNTER — Encounter: Payer: Self-pay | Admitting: Family Medicine

## 2019-06-24 ENCOUNTER — Ambulatory Visit (INDEPENDENT_AMBULATORY_CARE_PROVIDER_SITE_OTHER): Payer: 59 | Admitting: Family Medicine

## 2019-06-24 ENCOUNTER — Other Ambulatory Visit: Payer: Self-pay

## 2019-06-24 VITALS — Ht 62.5 in | Wt 163.0 lb

## 2019-06-24 DIAGNOSIS — E663 Overweight: Secondary | ICD-10-CM | POA: Diagnosis not present

## 2019-06-24 DIAGNOSIS — E119 Type 2 diabetes mellitus without complications: Secondary | ICD-10-CM | POA: Diagnosis not present

## 2019-06-24 NOTE — Patient Instructions (Signed)
Thank you for completing your visit via telephone. I appreciate the opportunity to provide you with the care for your health and wellness. Today we discussed: Diabetes  Follow-up: As currently scheduled for October 2   *flu clinic appointment needed  We will be getting labs October 2  So so proud of you and so glad that you are feeling better after taking control of your life with management of your diabetes.  This is not an easy task it is definitely something that you have to have strong willpower and discipline to do which you have certainly proven.  I am very excited to see what your numbers will show in October after all your hard work.  Please continue to work out and avoid foods that you know are unhealthy for you.  Stay strong stay healthy and stay safe.  Please continue to practice social distancing to keep you, your family, and our community safe.  If you must go out, please wear a Mask and practice good handwashing.  [place] YOUR HANDS WELL AND FREQUENTLY. AVOID TOUCHING YOUR FACE, UNLESS YOUR HANDS ARE FRESHLY WASHED.  GET FRESH AIR DAILY. STAY HYDRATED WITH WATER.   It was a pleasure to see you and I look forward to continuing to work together on your health and well-being. Please do not hesitate to call the office if you need care or have questions about your care.  Have a wonderful day and week. With Gratitude, Cherly Beach, DNP, AGNP-BC

## 2019-06-24 NOTE — Progress Notes (Signed)
Virtual Visit via Telephone Note   This visit type was conducted due to national recommendations for restrictions regarding the COVID-19 Pandemic (e.g. social distancing) in an effort to limit this patient's exposure and mitigate transmission in our community.  Due to her co-morbid illnesses, this patient is at least at moderate risk for complications without adequate follow up.  This format is felt to be most appropriate for this patient at this time.  The patient did not have access to video technology/had technical difficulties with video requiring transitioning to audio format only (telephone).  All issues noted in this document were discussed and addressed.  No physical exam could be performed with this format.   Evaluation Performed:  Follow-up visit  Date:  06/24/2019   ID:  Jennifer Velasquez, Jennifer Velasquez May 16, 1956, MRN [id number]  Patient Location: Home Provider Location: Office  Location of Patient: Home Location of Provider: Telehealth Consent was obtain for visit to be over via telehealth. I verified that I am speaking with the correct person using two identifiers.  PCP:  Perlie Mayo, NP   Chief Complaint:  DM follow up  History of Present Illness:    Jennifer Velasquez is a 63 y.o. female with   Reports feeling a lot better. Reports blood sugar numbers are lower.  Diabetes follow-up:  Blood sugars at home are running 99, 215 Highest and Lowest.   Sees more in the 140-150. Denies hypoglycemia.  Denies polydipsia and polyuria.  Last eye exam still needs to get this set up.  Last foot exam in office was 05/2019.  Last A1c 14%-recheck planned for Oct 2020. Denies non-healing wounds or rashes. Denies signs of UTI or other infections. Patient follows a low sugar diet and checks feet regularly without concerns. Reports staying hydrated by drinking water.   Has avoided [employer] Frappes. Is walking daily-is on the trails walking as she does the appt. She reports weight gain. Even with  improvement to food and exercise.  The patient does not have symptoms concerning for COVID-19 infection (fever, chills, cough, or new shortness of breath).   Past Medical, Surgical, Social History, Allergies, and Medications have been Reviewed.   Past Medical History:  Diagnosis Date  . Back pain   . Neck pain    left   . Need for prophylactic hormone replacement therapy (postmenopausal)   . Obesity, unspecified   . Other and unspecified hyperlipidemia   . Routine general medical examination at a health care facility   . Trichomonal vaginitis   . Type II or unspecified type diabetes mellitus without mention of complication, not stated as uncontrolled   . Unspecified constipation   . Unspecified essential hypertension    Past Surgical History:  Procedure Laterality Date  . APPENDECTOMY    . DENTAL SURGERY    . TOTAL ABDOMINAL HYSTERECTOMY     fibroids     Current Meds  Medication Sig  . glipiZIDE (GLUCOTROL XL) 5 MG 24 hr tablet Take 1 tablet (5 mg total) by mouth daily with breakfast.  . metFORMIN (GLUCOPHAGE) 500 MG tablet Take 1 tablet (500 mg total) by mouth 2 (two) times daily with a meal.     Allergies:   Pravastatin sodium   Social History   Tobacco Use  . Smoking status: Former Research scientist (life sciences)  . Smokeless tobacco: Never Used  Substance Use Topics  . Alcohol use: Yes  . Drug use: No     Family Hx: The patient's family history includes  Cancer in her daughter and father; Hypertension in her father, sister, and sister; Pneumonia in her mother; Transient ischemic attack in her father.  ROS:   Please see the history of present illness.    All other systems reviewed and are negative.   Labs/Other Tests and Data Reviewed:      Recent Labs: 04/15/2019: ALT 11; BUN 10; Creat 0.74; Hemoglobin 14.1; Platelets 300; Potassium 4.1; Sodium 134   Recent Lipid Panel Lab Results  Component Value Date/Time   CHOL 186 11/20/2011 10:30 AM   TRIG 174 (H) 11/20/2011 10:30 AM    HDL 33 (L) 11/20/2011 10:30 AM   CHOLHDL 5.6 11/20/2011 10:30 AM   LDLCALC 118 (H) 11/20/2011 10:30 AM    Wt Readings from Last 3 Encounters:  06/24/19 163 lb (73.9 kg)  05/21/19 161 lb 1.9 oz (73.1 kg)  04/30/19 158 lb (71.7 kg)     Objective:    Vital Signs:  Ht 5' 2.5" (1.588 m)   Wt 163 lb (73.9 kg)   BMI 29.34 kg/m    GEN:  Alert and oriented RESPIRATORY:  No shortness of breath noted in conversation PSYCH:  Normal affect and mood pleasant communication  ASSESSMENT & PLAN:    1. Diabetes mellitus without complication (HCC) Jennifer Velasquez has definitely embraced having diabetes.  She verbally speaks to the fact that she was in denial and now is feeling much better now that she started to take care of herself.  She is excited to see what her new numbers are with her labs in another month.  Overall she can tell that she is definitely feeling better and that speaks volumes to her and continuing her ongoing process of taking care of herself and managing her diabetes appropriately.  Continue current medications. NEEDS TO START STATIN  Educated on importance of maintain a well balanced diabetic friendly diet.  She is reminded the importance of maintaining  good blood sugars,  taking medications as directed, daily foot care, annual eye exams. Additionally educated about keeping good control over blood pressure and cholesterol as well.  2. Overweight (BMI 25.0-29.9) Educated on transitioning of weight.  That she probably is gaining muscle mass secondary to putting on weight.  Will be having her come back in October will get weight check then in the office.  Educated on making sure that she continues the process of what she is doing which is eating healthier and doing her workout/walks.  Extremely proud of her embracing her lifestyle change.  Congratulated to help make sure that she continues this process.  Time:   Today, I have spent 10 minutes with the patient with telehealth  technology discussing the above problems.     Medication Adjustments/Labs and Tests Ordered: Current medicines are reviewed at length with the patient today.  Concerns regarding medicines are outlined above.   Tests Ordered: No orders of the defined types were placed in this encounter.   Medication Changes: No orders of the defined types were placed in this encounter.   Disposition:  Follow up 07/16/2019  Signed, Freddy FinnerHannah M Corrie Brannen, NP  06/24/2019 9:26 AM     [facility]

## 2019-06-28 ENCOUNTER — Other Ambulatory Visit: Payer: Self-pay

## 2019-06-28 ENCOUNTER — Ambulatory Visit (INDEPENDENT_AMBULATORY_CARE_PROVIDER_SITE_OTHER): Payer: 59

## 2019-06-28 DIAGNOSIS — Z23 Encounter for immunization: Secondary | ICD-10-CM

## 2019-07-08 ENCOUNTER — Encounter: Payer: Self-pay | Admitting: Family Medicine

## 2019-07-08 ENCOUNTER — Other Ambulatory Visit: Payer: Self-pay

## 2019-07-08 ENCOUNTER — Ambulatory Visit (INDEPENDENT_AMBULATORY_CARE_PROVIDER_SITE_OTHER): Payer: 59 | Admitting: Family Medicine

## 2019-07-08 VITALS — BP 130/80 | Ht 62.5 in | Wt 165.0 lb

## 2019-07-08 DIAGNOSIS — E119 Type 2 diabetes mellitus without complications: Secondary | ICD-10-CM

## 2019-07-08 DIAGNOSIS — I10 Essential (primary) hypertension: Secondary | ICD-10-CM | POA: Diagnosis not present

## 2019-07-08 MED ORDER — GLIPIZIDE ER 5 MG PO TB24: 5.0000 mg | ORAL_TABLET | Freq: Every day | ORAL | 0 refills | Status: DC

## 2019-07-08 MED ORDER — METFORMIN HCL 500 MG PO TABS: 500.0000 mg | ORAL_TABLET | Freq: Two times a day (BID) | ORAL | 0 refills | Status: DC

## 2019-07-08 NOTE — Progress Notes (Signed)
Virtual Visit via Telephone Note   This visit type was conducted due to national recommendations for restrictions regarding the COVID-19 Pandemic (e.g. social distancing) in an effort to limit this patient's exposure and mitigate transmission in our community.  Due to her co-morbid illnesses, this patient is at least at moderate risk for complications without adequate follow up.  This format is felt to be most appropriate for this patient at this time.  The patient did not have access to video technology/had technical difficulties with video requiring transitioning to audio format only (telephone).  All issues noted in this document were discussed and addressed.  No physical exam could be performed with this format.   Evaluation Performed:  Follow-up visit  Date:  07/08/2019   ID:  Jennifer Velasquez, Jennifer Velasquez 1955-12-17, MRN [id number]  Patient Location: Home Provider Location: Office  Location of Patient: Home Location of Provider: Telehealth Consent was obtain for visit to be over via telehealth. I verified that I am speaking with the correct person using two identifiers.  PCP:  Freddy Finner, NP   Chief Complaint:  DM and HTN  History of Present Illness:    Jennifer Velasquez is a 63 y.o. female with   Diabetes follow-up:  Blood sugars at home are running 85-204. Sees mostly 130's, some 150's.  Denies hypoglycemia.  Denies polydipsia and polyuria.  Last eye exam was needs referral-but is checking to see if her current eye dr does that.  Last foot exam in office was 05/2019  Last A1c14%-recheck planned for Oct.  Denies non-healing wounds or rashes. Denies signs of UTI or other infections. Patient follows a low sugar diet and checks feet regularly without concerns. Reports staying hydrated by drinking water.   Here for follow-up of hypertension. Reports walking most days or does exercise tape. Reports eating adherent to a low-salt diet.  Blood pressure is well controlled at home.  SBP usually ranges  130.   DBP usually ranges 80's.  Cardiac symptoms: none. Patient denies: chest pain, chest pressure/discomfort, claudication, dyspnea, exertional chest pressure/discomfort, fatigue, irregular heart beat, lower extremity edema, near-syncope, orthopnea, palpitations, paroxysmal nocturnal dyspnea, syncope and tachypnea. Cardiovascular risk factors: diabetes mellitus, dyslipidemia, hypertension and sedentary lifestyle. Use of agents associated with hypertension: none.  Reports taking all medications as directed and denies side effects.  The patient does not have symptoms concerning for COVID-19 infection (fever, chills, cough, or new shortness of breath).   Past Medical, Surgical, Social History, Allergies, and Medications have been Reviewed.   Past Medical History:  Diagnosis Date  . Back pain   . Neck pain    left   . Need for prophylactic hormone replacement therapy (postmenopausal)   . Obesity, unspecified   . Other and unspecified hyperlipidemia   . Routine general medical examination at a health care facility   . Trichomonal vaginitis   . Type II or unspecified type diabetes mellitus without mention of complication, not stated as uncontrolled   . Unspecified constipation   . Unspecified essential hypertension    Past Surgical History:  Procedure Laterality Date  . APPENDECTOMY    . DENTAL SURGERY    . TOTAL ABDOMINAL HYSTERECTOMY     fibroids     Current Meds  Medication Sig  . glipiZIDE (GLUCOTROL XL) 5 MG 24 hr tablet Take 1 tablet (5 mg total) by mouth daily with breakfast.  . metFORMIN (GLUCOPHAGE) 500 MG tablet Take 1 tablet (500 mg total) by mouth 2 (  two) times daily with a meal.  . [DISCONTINUED] glipiZIDE (GLUCOTROL XL) 5 MG 24 hr tablet Take 1 tablet (5 mg total) by mouth daily with breakfast.  . [DISCONTINUED] metFORMIN (GLUCOPHAGE) 500 MG tablet Take 1 tablet (500 mg total) by mouth 2 (two) times daily with a meal.     Allergies:   Pravastatin sodium   Social  History   Tobacco Use  . Smoking status: Former Games developermoker  . Smokeless tobacco: Never Used  Substance Use Topics  . Alcohol use: Yes  . Drug use: No     Family Hx: The patient's family history includes Cancer in her daughter and father; Hypertension in her father, sister, and sister; Pneumonia in her mother; Transient ischemic attack in her father.  ROS:   Please see the history of present illness.    All other systems reviewed and are negative.   Labs/Other Tests and Data Reviewed:     Recent Labs: 04/15/2019: ALT 11; BUN 10; Creat 0.74; Hemoglobin 14.1; Platelets 300; Potassium 4.1; Sodium 134   Recent Lipid Panel Lab Results  Component Value Date/Time   CHOL 186 11/20/2011 10:30 AM   TRIG 174 (H) 11/20/2011 10:30 AM   HDL 33 (L) 11/20/2011 10:30 AM   CHOLHDL 5.6 11/20/2011 10:30 AM   LDLCALC 118 (H) 11/20/2011 10:30 AM    Wt Readings from Last 3 Encounters:  07/08/19 165 lb (74.8 kg)  06/24/19 163 lb (73.9 kg)  05/21/19 161 lb 1.9 oz (73.1 kg)     Objective:    Vital Signs:  Ht 5' 2.5" (1.588 m)   Wt 165 lb (74.8 kg)   BMI 29.70 kg/m    GEN:  Alert and oriented RESPIRATORY:  No shortness of breath noted in conversation PSYCH:  Normal affect and mood pleasant communication  ASSESSMENT & PLAN:    1. Diabetes mellitus without complication [facility]) Rondel BatonMona F [place] is encouraged to continue to check blood sugar daily as directed. Continue current medications. NEEDS A STATIN-wants to wait Educated on importance of maintain a well balanced diabetic friendly diet. She is reminded the importance of maintaining  good blood sugars,  taking medications as directed, daily foot care, annual eye exams. Additionally educated about keeping good control over blood pressure and cholesterol as well.  Getting A1c in Oct  - glipiZIDE (GLUCOTROL XL) 5 MG 24 hr tablet; Take 1 tablet (5 mg total) by mouth daily with breakfast.  Dispense: 90 tablet; Refill: 0 - metFORMIN (GLUCOPHAGE)  500 MG tablet; Take 1 tablet (500 mg total) by mouth 2 (two) times daily with a meal.  Dispense: 90 tablet; Refill: 0  2. Essential hypertension Rondel BatonMona F Holik is encouraged to maintain a well balanced diet that is low in salt. Controlled Additionally, she is also reminded to continue to exercise, as it is beneficial for heart health and control of  Blood pressure. 30-60 minutes daily is recommended-walking was suggested.  Time:   Today, I have spent 10  minutes with the patient with telehealth technology discussing the above problems.     Medication Adjustments/Labs and Tests Ordered: Current medicines are reviewed at length with the patient today.  Concerns regarding medicines are outlined above.   Tests Ordered: No orders of the defined types were placed in this encounter.   Medication Changes: Meds ordered this encounter  Medications  . glipiZIDE (GLUCOTROL XL) 5 MG 24 hr tablet    Sig: Take 1 tablet (5 mg total) by mouth daily with breakfast.  Dispense:  90 tablet    Refill:  0  . metFORMIN (GLUCOPHAGE) 500 MG tablet    Sig: Take 1 tablet (500 mg total) by mouth 2 (two) times daily with a meal.    Dispense:  90 tablet    Refill:  0    Disposition:  Follow up 3 months   Signed, Perlie Mayo, NP  07/08/2019 10:08 AM     [facility]

## 2019-07-08 NOTE — Patient Instructions (Signed)
    I appreciate the opportunity to provide you with the care for your health and wellness. Today we discussed: diabetes and blood pressure  Follow up: First week of Jan   Please remember to get labs after October 2  Again, I am very proud of you managing your diabetes.  It is not easy but you continue to show discipline to do what you need for your health  I am very excited to see what your numbers will show in October after all your hard work.  Please continue to work out and avoid foods that you know are unhealthy for you.  Stay strong stay healthy and stay safe.  Please continue to practice social distancing to keep you, your family, and our community safe.  If you must go out, please wear a Mask and practice good handwashing.  GET FRESH AIR DAILY. STAY HYDRATED WITH WATER.   It was a pleasure to see you and I look forward to continuing to work together on your health and well-being. Please do not hesitate to call the office if you need care or have questions about your care.  Have a wonderful day and week. With Gratitude, Cherly Beach, DNP, AGNP-BC

## 2019-07-16 ENCOUNTER — Ambulatory Visit: Payer: 59 | Admitting: Family Medicine

## 2019-07-20 ENCOUNTER — Other Ambulatory Visit (HOSPITAL_COMMUNITY): Payer: Self-pay | Admitting: Family Medicine

## 2019-07-20 DIAGNOSIS — Z1231 Encounter for screening mammogram for malignant neoplasm of breast: Secondary | ICD-10-CM

## 2019-07-22 ENCOUNTER — Ambulatory Visit: Payer: 59 | Admitting: Family Medicine

## 2019-07-23 ENCOUNTER — Telehealth: Payer: Self-pay | Admitting: Family Medicine

## 2019-07-23 ENCOUNTER — Other Ambulatory Visit: Payer: Self-pay | Admitting: Family Medicine

## 2019-07-23 DIAGNOSIS — E785 Hyperlipidemia, unspecified: Secondary | ICD-10-CM

## 2019-07-23 DIAGNOSIS — E119 Type 2 diabetes mellitus without complications: Secondary | ICD-10-CM

## 2019-07-23 DIAGNOSIS — E1169 Type 2 diabetes mellitus with other specified complication: Secondary | ICD-10-CM

## 2019-07-23 NOTE — Telephone Encounter (Signed)
Needs her labs entered into the computer  Pt called and broke her pinky toe on 10-8, and was told to stay off foot, so will go first of the week to the lab, if able.

## 2019-07-23 NOTE — Telephone Encounter (Signed)
Which labs do you want drawn? 

## 2019-07-23 NOTE — Telephone Encounter (Signed)
Advised patient that her labs have been ordered and they are fasting.

## 2019-08-02 ENCOUNTER — Ambulatory Visit (HOSPITAL_COMMUNITY)
Admission: RE | Admit: 2019-08-02 | Discharge: 2019-08-02 | Disposition: A | Discharge status: Home / Self Care | Payer: 59 | Source: Ambulatory Visit | Attending: Family Medicine | Admitting: Family Medicine

## 2019-08-02 ENCOUNTER — Other Ambulatory Visit: Payer: Self-pay

## 2019-08-02 DIAGNOSIS — Z1231 Encounter for screening mammogram for malignant neoplasm of breast: Secondary | ICD-10-CM | POA: Diagnosis not present

## 2019-08-07 LAB — HEMOGLOBIN A1C
Hgb A1c MFr Bld: 8.9 % of total Hgb — ABNORMAL HIGH (ref <5.7)
Mean Plasma Glucose: 209 (calc)
eAG (mmol/L): 11.6 (calc)

## 2019-08-07 LAB — BASIC METABOLIC PANEL WITH GFR
BUN: 13 mg/dL (ref 7–25)
CO2: 28 mmol/L (ref 20–32)
Calcium: 9.1 mg/dL (ref 8.6–10.4)
Chloride: 106 mmol/L (ref 98–110)
Creat: 0.71 mg/dL (ref 0.50–0.99)
GFR, Est African American: 104 mL/min/{1.73_m2} (ref >=60)
GFR, Est Non African American: 90 mL/min/{1.73_m2} (ref >=60)
Glucose, Bld: 147 mg/dL — ABNORMAL HIGH (ref 65–99)
Potassium: 4.1 mmol/L (ref 3.5–5.3)
Sodium: 141 mmol/L (ref 135–146)

## 2019-08-07 LAB — LIPID PANEL
Cholesterol: 195 mg/dL (ref <200)
HDL: 40 mg/dL — ABNORMAL LOW (ref >=50)
LDL Cholesterol (Calc): 134 mg/dL (calc) — ABNORMAL HIGH
Non-HDL Cholesterol (Calc): 155 mg/dL (calc) — ABNORMAL HIGH (ref <130)
Total CHOL/HDL Ratio: 4.9 (calc) (ref <5.0)
Triglycerides: 106 mg/dL (ref <150)

## 2019-08-10 ENCOUNTER — Other Ambulatory Visit: Payer: Self-pay | Admitting: Family Medicine

## 2019-08-10 DIAGNOSIS — E119 Type 2 diabetes mellitus without complications: Secondary | ICD-10-CM

## 2019-08-10 DIAGNOSIS — E1169 Type 2 diabetes mellitus with other specified complication: Secondary | ICD-10-CM

## 2019-08-10 MED ORDER — ROSUVASTATIN CALCIUM 5 MG PO TABS: 5.0000 mg | ORAL_TABLET | Freq: Every day | ORAL | 1 refills | Status: DC

## 2019-08-10 MED ORDER — GLIPIZIDE ER 10 MG PO TB24: 10.0000 mg | ORAL_TABLET | Freq: Every day | ORAL | 3 refills | Status: DC

## 2019-08-10 NOTE — Progress Notes (Signed)
Please call Jennifer Velasquez and let her know that her results are back. She has done a tremendous job in trying to get her diabetes under control she is almost cut her A1c level in half!!! This is great news, she is at 8.9% and I would like to see her at 7% at the next 3 month check. Please ask if she is willing to increase her glipizide to 10 mg daily. Overall I am very happy with her change.  We could also potentially wait another 3 months just to see if she continues to come down.   But she did have demonstrated elevated fasting glucose which means that she is not where she needs to be at this time.   So an adjustment to medication might help get her where she needs to be quicker.  Additionally avoiding high carbohydrate sugary foods.  Is also important 30 minutes of exercise daily at a minimum. Kidney function is good. Cholesterol however is still elevated, she needs to be on a statin medication secondary to having diabetes has an elevated cholesterol levels.  I will start her on a low-dose if she is willing. Even if she were to get her cholesterol under control it is highly recommended that diabetics be on a statin medication as there is excessive complication related to heart disease and diabetic patients. Please remind her of her next appointment and congratulate her again on the excellent reduction in her A1c.

## 2019-09-20 ENCOUNTER — Other Ambulatory Visit: Payer: Self-pay

## 2019-09-20 DIAGNOSIS — E119 Type 2 diabetes mellitus without complications: Secondary | ICD-10-CM

## 2019-09-20 MED ORDER — METFORMIN HCL 500 MG PO TABS: 500.0000 mg | ORAL_TABLET | Freq: Two times a day (BID) | ORAL | 0 refills | Status: DC

## 2019-09-27 ENCOUNTER — Telehealth: Payer: Self-pay | Admitting: *Deleted

## 2019-09-27 NOTE — Telephone Encounter (Signed)
Spoke with patient and let her know you wouldn't be available until tomorrow, but did recommend maybe trying a hot shower to see if the steam would loosen up her nasal congestion or she could try Vicks Vapor Rub tablets for the shower and see if that would help. Advised her I would call back tomorrow after you responded with verbal understanding.

## 2019-09-27 NOTE — Telephone Encounter (Signed)
Pt tested positive for covid she has a follow up appt by phone Thursday. She wanted to know what she could take being that her nose was really stopped up.

## 2019-09-28 NOTE — Telephone Encounter (Signed)
Called patient to discuss response from provider. No answer and vm is full. Will try again later.

## 2019-09-28 NOTE — Telephone Encounter (Signed)
Called patient to give her the providers advice. No answer and unable to leave vm. Will try again later.

## 2019-09-29 ENCOUNTER — Ambulatory Visit (INDEPENDENT_AMBULATORY_CARE_PROVIDER_SITE_OTHER): Payer: 59 | Admitting: Family Medicine

## 2019-09-29 ENCOUNTER — Encounter: Payer: Self-pay | Admitting: Family Medicine

## 2019-09-29 ENCOUNTER — Other Ambulatory Visit: Payer: Self-pay

## 2019-09-29 DIAGNOSIS — M545 Low back pain, unspecified: Secondary | ICD-10-CM

## 2019-09-29 DIAGNOSIS — U071 COVID-19: Secondary | ICD-10-CM

## 2019-09-29 HISTORY — DX: COVID-19: U07.1

## 2019-09-29 MED ORDER — IBUPROFEN 800 MG PO TABS: 800.0000 mg | ORAL_TABLET | Freq: Three times a day (TID) | ORAL | 0 refills | Status: DC | PRN

## 2019-09-29 NOTE — Patient Instructions (Signed)
  I appreciate the opportunity to provide you with care for your health and wellness. Today we discussed: back pain   Follow up: 10/21/2019  No labs or referrals today  I have sent in medication to help with pain-please take with food or glass of milk. Use a heating pad as needed, stretch several times daily and make sure you are walking frequently. Drink plenty of water.  If you develop numbness, changes in sensation, weakness in walking or loss of bladder or bowel please go to the ED. Mask up and let them know you are positive as soon as you go in.  I hope you have a wonderful, happy, safe, and healthy Holiday Season! See you in the New Year :)  Please continue to practice social distancing to keep you, your family, and our community safe.  If you must go out, please wear a mask and practice good handwashing.  It was a pleasure to see you and I look forward to continuing to work together on your health and well-being. Please do not hesitate to call the office if you need care or have questions about your care.  Have a wonderful day and week. With Gratitude, Cherly Beach, DNP, AGNP-BC

## 2019-09-29 NOTE — Progress Notes (Signed)
Virtual Visit via Telephone Note   This visit type was conducted due to national recommendations for restrictions regarding the COVID-19 Pandemic (e.g. social distancing) in an effort to limit this patient's exposure and mitigate transmission in our community.  Due to her co-morbid illnesses, this patient is at least at moderate risk for complications without adequate follow up.  This format is felt to be most appropriate for this patient at this time.  The patient did not have access to video technology/had technical difficulties with video requiring transitioning to audio format only (telephone).  All issues noted in this document were discussed and addressed.  No physical exam could be performed with this format.    Evaluation Performed:  Follow-up visit  Date:  09/29/2019   ID:  Jennifer, Velasquez Jan 08, 1956, MRN [id number]  Patient Location: Home Provider Location: Office  Location of Patient: Home Location of Provider: Telehealth Consent was obtain for visit to be over via telehealth. I verified that I am speaking with the correct person using two identifiers.  PCP:  Perlie Mayo, NP   Chief Complaint:  covid +, back pain and leg pain   History of Present Illness:    Jennifer Velasquez is a 63 y.o. female with history of hypertension, diabetes type 2, hyperlipidemia, back pain, depression, obesity among others.  Recently diagnosed with COVID-19.  Is doing home isolation at this time.  Denies having any signs of symptoms at this time that correlate with Covid reports that she is feeling much better does not have any stuffy nose she did use Vicks VapoRub earlier this week for some congestion.  At this time she reports that she has extreme back discomfort with some leg discomfort.  History of pain in back and neck. New onset of back pain for "unsure" days. This pain is located in the lower back with radiation into the buttock and posterior thigh. Pain score today 10 /10. At its worse  the pain is a level 10/10. It has started to disturb sleep and limiting movement. It is relieved by rest . It is aggravated by starting to move. Modifying factors have included   There is no associated lower extremity numbness or weakness. There is no associated incontinence of stool or urine.  The patient does not have symptoms currently for COVID-19 infection (fever, chills, cough, or new shortness of breath).   Past Medical, Surgical, Social History, Allergies, and Medications have been Reviewed. Past Medical History:  Diagnosis Date  . Back pain   . Neck pain    left   . Need for prophylactic hormone replacement therapy (postmenopausal)   . Obesity, unspecified   . Other and unspecified hyperlipidemia   . Routine general medical examination at a health care facility   . Trichomonal vaginitis   . Type II or unspecified type diabetes mellitus without mention of complication, not stated as uncontrolled   . Unspecified constipation   . Unspecified essential hypertension    Past Surgical History:  Procedure Laterality Date  . APPENDECTOMY    . DENTAL SURGERY    . TOTAL ABDOMINAL HYSTERECTOMY     fibroids     No outpatient medications have been marked as taking for the 09/29/19 encounter (Appointment) with Perlie Mayo, NP.     Allergies:   Pravastatin sodium   Social History   Tobacco Use  . Smoking status: Former Research scientist (life sciences)  . Smokeless tobacco: Never Used  Substance Use Topics  . Alcohol  use: Yes  . Drug use: No     Family Hx: The patient's family history includes Cancer in her daughter and father; Hypertension in her father, sister, and sister; Pneumonia in her mother; Transient ischemic attack in her father.  ROS:   Please see the history of present illness.    All other systems reviewed and are negative.   Labs/Other Tests and Data Reviewed:    Recent Labs: 04/15/2019: ALT 11; Hemoglobin 14.1; Platelets 300 08/06/2019: BUN 13; Creat 0.71; Potassium 4.1;  Sodium 141   Recent Lipid Panel Lab Results  Component Value Date/Time   CHOL 195 08/06/2019 10:53 AM   TRIG 106 08/06/2019 10:53 AM   HDL 40 (L) 08/06/2019 10:53 AM   CHOLHDL 4.9 08/06/2019 10:53 AM   LDLCALC 134 (H) 08/06/2019 10:53 AM    Wt Readings from Last 3 Encounters:  07/08/19 165 lb (74.8 kg)  06/24/19 163 lb (73.9 kg)  05/21/19 161 lb 1.9 oz (73.1 kg)     Objective:    Vital Signs:  There were no vitals taken for this visit.   GEN:  Alert and oriented RESPIRATORY:  No shortness of breath noted in conversation PSYCH:  Normal affect, depressed mood  ASSESSMENT & PLAN:    1. Acute bilateral low back pain, unspecified whether sciatica present History of back pain in the past.  New onset but she is unable to tell me when it started.  Is having a lot of discomfort and pain.  Will try high-dose ibuprofen at this time.  Provided with education on signs and symptoms to look for that would require her to go to the emergency room.  In addition to back care needs.  - ibuprofen (ADVIL) 800 MG tablet; Take 1 tablet (800 mg total) by mouth every 8 (eight) hours as needed for moderate pain.  Dispense: 30 tablet; Refill: 0  2. COVID-19 virus infection Provided with extensive education about dealing with coronavirus and symptom management.  In addition to home isolation.  Reports that she has been staying at home and has made I can go to the pharmacy for her today.  Provided with symptoms that would require her to go to the nearest emergency room for possible worsening infection.Patient acknowledged agreement and understanding of the plan.    Time:   Today, I have spent 10 minutes with the patient with telehealth technology discussing the above problems.     Medication Adjustments/Labs and Tests Ordered: Current medicines are reviewed at length with the patient today.  Concerns regarding medicines are outlined above.   Tests Ordered: No orders of the defined types were placed in  this encounter.   Medication Changes: No orders of the defined types were placed in this encounter.   Disposition:  Follow up as scheduled   Signed, Freddy Finner, NP  09/29/2019 8:59 AM     [facility]

## 2019-09-29 NOTE — Assessment & Plan Note (Signed)
Hx of back pain, new onset but unable to tell me when. Will try ibuprofen 800 mg to see if that helps. Advised on strict ED precautions and back care.

## 2019-09-30 ENCOUNTER — Emergency Department (HOSPITAL_COMMUNITY)
Admission: EM | Admit: 2019-09-30 | Discharge: 2019-09-30 | Disposition: A | Discharge status: Home / Self Care | Payer: 59 | Attending: Emergency Medicine | Admitting: Emergency Medicine

## 2019-09-30 ENCOUNTER — Ambulatory Visit: Payer: 59 | Admitting: Family Medicine

## 2019-09-30 ENCOUNTER — Other Ambulatory Visit: Payer: Self-pay

## 2019-09-30 ENCOUNTER — Encounter (HOSPITAL_COMMUNITY): Payer: Self-pay

## 2019-09-30 DIAGNOSIS — R509 Fever, unspecified: Secondary | ICD-10-CM | POA: Diagnosis present

## 2019-09-30 DIAGNOSIS — E860 Dehydration: Secondary | ICD-10-CM | POA: Insufficient documentation

## 2019-09-30 DIAGNOSIS — R05 Cough: Secondary | ICD-10-CM | POA: Diagnosis not present

## 2019-09-30 DIAGNOSIS — U071 COVID-19: Secondary | ICD-10-CM | POA: Diagnosis not present

## 2019-09-30 DIAGNOSIS — Z87891 Personal history of nicotine dependence: Secondary | ICD-10-CM | POA: Diagnosis not present

## 2019-09-30 DIAGNOSIS — M7918 Myalgia, other site: Secondary | ICD-10-CM | POA: Diagnosis not present

## 2019-09-30 DIAGNOSIS — R630 Anorexia: Secondary | ICD-10-CM | POA: Insufficient documentation

## 2019-09-30 DIAGNOSIS — Z7984 Long term (current) use of oral hypoglycemic drugs: Secondary | ICD-10-CM | POA: Diagnosis not present

## 2019-09-30 DIAGNOSIS — E119 Type 2 diabetes mellitus without complications: Secondary | ICD-10-CM | POA: Insufficient documentation

## 2019-09-30 LAB — CBC WITH DIFFERENTIAL/PLATELET
Abs Immature Granulocytes: 0.01 10*3/uL (ref 0.00–0.07)
Basophils Absolute: 0 10*3/uL (ref 0.0–0.1)
Basophils Relative: 0 %
Eosinophils Absolute: 0 10*3/uL (ref 0.0–0.5)
Eosinophils Relative: 0 %
HCT: 43.8 % (ref 36.0–46.0)
Hemoglobin: 14.6 g/dL (ref 12.0–15.0)
Immature Granulocytes: 0 %
Lymphocytes Relative: 52 %
Lymphs Abs: 1.8 10*3/uL (ref 0.7–4.0)
MCH: 28.6 pg (ref 26.0–34.0)
MCHC: 33.3 g/dL (ref 30.0–36.0)
MCV: 85.9 fL (ref 80.0–100.0)
Monocytes Absolute: 0.2 10*3/uL (ref 0.1–1.0)
Monocytes Relative: 7 %
Neutro Abs: 1.4 10*3/uL — ABNORMAL LOW (ref 1.7–7.7)
Neutrophils Relative %: 41 %
Platelets: 260 10*3/uL (ref 150–400)
RBC: 5.1 MIL/uL (ref 3.87–5.11)
RDW: 11.9 % (ref 11.5–15.5)
WBC: 3.4 10*3/uL — ABNORMAL LOW (ref 4.0–10.5)
nRBC: 0 % (ref 0.0–0.2)

## 2019-09-30 LAB — COMPREHENSIVE METABOLIC PANEL
ALT: 24 U/L (ref 0–44)
AST: 27 U/L (ref 15–41)
Albumin: 3.8 g/dL (ref 3.5–5.0)
Alkaline Phosphatase: 73 U/L (ref 38–126)
Anion gap: 10 (ref 5–15)
BUN: 14 mg/dL (ref 8–23)
CO2: 24 mmol/L (ref 22–32)
Calcium: 8.4 mg/dL — ABNORMAL LOW (ref 8.9–10.3)
Chloride: 97 mmol/L — ABNORMAL LOW (ref 98–111)
Creatinine, Ser: 1.07 mg/dL — ABNORMAL HIGH (ref 0.44–1.00)
GFR calc Af Amer: >60 mL/min (ref >60)
GFR calc non Af Amer: 55 mL/min — ABNORMAL LOW (ref >60)
Glucose, Bld: 195 mg/dL — ABNORMAL HIGH (ref 70–99)
Potassium: 3.9 mmol/L (ref 3.5–5.1)
Sodium: 131 mmol/L — ABNORMAL LOW (ref 135–145)
Total Bilirubin: 0.7 mg/dL (ref 0.3–1.2)
Total Protein: 7.8 g/dL (ref 6.5–8.1)

## 2019-09-30 MED ORDER — SODIUM CHLORIDE 0.9 % IV BOLUS
1000.0000 mL | Freq: Once | INTRAVENOUS | Status: AC
  Administered 2019-09-30: 1000 mL via INTRAVENOUS

## 2019-09-30 NOTE — ED Provider Notes (Signed)
[facility] EMERGENCY DEPARTMENT Provider Note   CSN: [id number] Arrival date & time: 09/30/19  1711     History Chief Complaint  Patient presents with  . Fever  . covid positive    Jennifer Velasquez is a 63 y.o. female with type 2 diabetes, hypertension who presents with a fever.  Patient tested positive for Covid on Saturday.  She has been running high fevers of 102.  She reports associated dry cough, body aches, decreased oral intake, and back pain.  Patient states that she is primarily here because she has been running high fevers and the medicine that she has been taking has not been improving her symptoms.  She saw her doctor yesterday for a televisit and she was having severe back pain going down both of her legs.  She was prescribed ibuprofen 800 mg and is been taking this every 8 hours with good relief.  She is not taking any Tylenol for her fever.  She denies chest pain, shortness of breath, abdominal pain, vomiting or diarrhea. 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 Back pain   . Neck pain    left   . Need for prophylactic hormone replacement therapy (postmenopausal)   . Obesity, unspecified   . Other and unspecified hyperlipidemia   . Routine general medical examination at a health care facility   . Trichomonal vaginitis   . Type II or unspecified type diabetes mellitus without mention of complication, not stated as uncontrolled   . Unspecified constipation   . Unspecified essential hypertension     Patient Active Problem List   Diagnosis Date Noted  . COVID-19 virus infection 09/29/2019  . Vitamin D deficiency 12/01/2011  . LYMPHADENOPATHY 05/28/2010  . DEPRESSION, SITUATIONAL 11/25/2008  . CARPAL TUNNEL SYNDROME, LEFT 06/02/2008  . Lumbago 07/20/2007  . DIABETES MELLITUS, TYPE II, CONTROLLED 01/07/2007  . Hyperlipidemia associated with type 2 diabetes mellitus ([place]) 09/11/2006  . OBESITY  NOS 09/11/2006  . Essential hypertension 09/11/2006    Past Surgical History:  Procedure Laterality Date  . APPENDECTOMY    . DENTAL SURGERY    . TOTAL ABDOMINAL HYSTERECTOMY     fibroids     OB History   No obstetric history on file.     Family History  Problem Relation Age of Onset  . Pneumonia Mother   . Transient ischemic attack Father   . Hypertension Father   . Cancer Father   . Hypertension Sister   . Hypertension Sister   . Cancer Daughter        breast     Social History   Tobacco Use  . Smoking status: Former Research scientist (life sciences)  . Smokeless tobacco: Never Used  Substance Use Topics  . Alcohol use: Yes  . Drug use: No    Home Medications Prior to Admission medications   Medication Sig Start Date End Date Taking? Authorizing Provider  glipiZIDE (GLUCOTROL XL) 10 MG 24 hr tablet Take 1 tablet (10 mg total) by mouth daily with breakfast. 08/10/19   Perlie Mayo, NP  ibuprofen (ADVIL) 800 MG tablet Take 1 tablet (800 mg total) by mouth every 8 (eight) hours as needed for moderate pain. 09/29/19   Perlie Mayo, NP  metFORMIN (GLUCOPHAGE) 500 MG tablet Take 1 tablet (500 mg total) by mouth 2 (two) times daily with a meal. 09/20/19   Cherly Beach  M, NP  rosuvastatin (CRESTOR) 5 MG tablet Take 1 tablet (5 mg total) by mouth daily. 08/10/19   Freddy FinnerMills, Hannah M, NP    Allergies    Pravastatin sodium  Review of Systems   Review of Systems  Constitutional: Positive for activity change, appetite change, fatigue and fever.  Respiratory: Positive for cough. Negative for shortness of breath.   Cardiovascular: Negative for chest pain.  Gastrointestinal: Negative for abdominal pain, diarrhea, nausea and vomiting.  Genitourinary: Negative for dysuria.  Neurological: Negative for headaches.  All other systems reviewed and are negative.   Physical Exam Updated Vital Signs BP (!) 125/95 (BP Location: Left Arm)   Pulse (!) 105   Temp 98.7 F (37.1 C) (Oral)   Resp 19    Ht 5\' 2"  (1.575 m)   Wt 74.4 kg   SpO2 95%   BMI 30.00 kg/m   Physical Exam Vitals and nursing note reviewed.  Constitutional:      General: She is not in acute distress.    Appearance: Normal appearance. She is well-developed. She is not ill-appearing.     Comments: Calm and cooperative.  Initially sleeping when I enter the room  HENT:     Head: Normocephalic and atraumatic.     Right Ear: Tympanic membrane normal.     Left Ear: Tympanic membrane normal.     Nose: Nose normal.     Mouth/Throat:     Mouth: Mucous membranes are moist.  Eyes:     General: No scleral icterus.       Right eye: No discharge.        Left eye: No discharge.     Conjunctiva/sclera: Conjunctivae normal.     Pupils: Pupils are equal, round, and reactive to light.  Cardiovascular:     Rate and Rhythm: Regular rhythm. Tachycardia present.  Pulmonary:     Effort: Pulmonary effort is normal. No respiratory distress.     Breath sounds: Normal breath sounds.  Abdominal:     General: There is no distension.     Palpations: Abdomen is soft.     Tenderness: There is no abdominal tenderness.  Musculoskeletal:     Cervical back: Normal range of motion.  Skin:    General: Skin is warm and dry.  Neurological:     Mental Status: She is alert and oriented to person, place, and time.  Psychiatric:        Behavior: Behavior normal.     ED Results / Procedures / Treatments   Labs (all labs ordered are listed, but only abnormal results are displayed) Labs Reviewed  COMPREHENSIVE METABOLIC PANEL - Abnormal; Notable for the following components:      Result Value   Sodium 131 (*)    Chloride 97 (*)    Glucose, Bld 195 (*)    Creatinine, Ser 1.07 (*)    Calcium 8.4 (*)    GFR calc non Af Amer 55 (*)    All other components within normal limits  CBC WITH DIFFERENTIAL/PLATELET - Abnormal; Notable for the following components:   WBC 3.4 (*)    Neutro Abs 1.4 (*)    All other components within normal limits      EKG None  Radiology No results found.  Procedures Procedures (including critical care time)  Medications Ordered in ED Medications  sodium chloride 0.9 % bolus 1,000 mL (1,000 mLs Intravenous New Bag/Given 09/30/19 1905)    ED Course  I have reviewed the triage vital signs  and the nursing notes.  Pertinent labs & imaging results that were available during my care of the patient were reviewed by me and considered in my medical decision making (see chart for details).    MDM Rules/Calculators/A&P  63 year old female with COVID presents with a fever. She is mildly tachycardic but otherwise vitals are reassuring. Exam is overall unremarkable. She reports decreased PO intake. Will check labs to assess for dehydration. She denies any significant SOB or cough therefore will defer CXR today.  7:16 PM Attempted to call the patient's daughter Jennifer Velasquez twice per her request but could not get through  CBC is remarkable for mild leukopenia which is likely due to acute viral illness. CMP is remarkable for mild hyponatremia (131), hypochloremia (97) and slightly elevated SCr from baseline. This is likely due to mild dehydration. She was given a fluid bolus. On recheck her HR has improved to 80s. She was advised to alternate Ibuprofen and Tylenol for her fever at home.  Jennifer Velasquez was evaluated in Emergency Department on 09/30/2019 for the symptoms described in the history of present illness. She was evaluated in the context of the global COVID-19 pandemic, which necessitated consideration that the patient might be at risk for infection with the SARS-CoV-2 virus that causes COVID-19. Institutional protocols and algorithms that pertain to the evaluation of patients at risk for COVID-19 are in a state of rapid change based on information released by regulatory bodies including the CDC and federal and state organizations. These policies and algorithms were followed during the patient's care in the  ED.  Final Clinical Impression(s) / ED Diagnoses Final diagnoses:  COVID-19  Mild dehydration    Rx / DC Orders ED Discharge Orders    None       Bethel Born, PA-C 09/30/19 2105    Bethann Berkshire, MD 09/30/19 2320

## 2019-09-30 NOTE — ED Notes (Signed)
Daughter Kennyth Lose requesting to be called after work up

## 2019-09-30 NOTE — Discharge Instructions (Signed)
Please alternate Tylenol and Ibuprofen for fever as needed Drink plenty of fluids Please return if you are worsening

## 2019-09-30 NOTE — ED Triage Notes (Signed)
Pt had exposure to covid a week ago and tested positive Saturday. Pt has been running fever ,body pain, coughing. SOB exertion. Pt has been taking motrin and\ tylenol

## 2019-10-12 ENCOUNTER — Other Ambulatory Visit: Payer: Self-pay

## 2019-10-12 MED ORDER — ROSUVASTATIN CALCIUM 5 MG PO TABS: 5.0000 mg | ORAL_TABLET | Freq: Every day | ORAL | 1 refills | Status: DC

## 2019-10-19 ENCOUNTER — Ambulatory Visit: Payer: 59 | Admitting: Family Medicine

## 2019-10-21 ENCOUNTER — Encounter: Payer: Self-pay | Admitting: Family Medicine

## 2019-10-21 ENCOUNTER — Other Ambulatory Visit: Payer: Self-pay

## 2019-10-21 ENCOUNTER — Ambulatory Visit (INDEPENDENT_AMBULATORY_CARE_PROVIDER_SITE_OTHER): Payer: 59 | Admitting: Family Medicine

## 2019-10-21 VITALS — BP 138/85 | Ht 62.5 in | Wt 160.0 lb

## 2019-10-21 DIAGNOSIS — Z7189 Other specified counseling: Secondary | ICD-10-CM | POA: Insufficient documentation

## 2019-10-21 DIAGNOSIS — U071 COVID-19: Secondary | ICD-10-CM | POA: Diagnosis not present

## 2019-10-21 HISTORY — DX: Other specified counseling: Z71.89

## 2019-10-21 NOTE — Progress Notes (Signed)
Virtual Visit via Telephone Note   This visit type was conducted due to national recommendations for restrictions regarding the COVID-19 Pandemic (e.g. social distancing) in an effort to limit this patient's exposure and mitigate transmission in our community.  Due to her co-morbid illnesses, this patient is at least at moderate risk for complications without adequate follow up.  This format is felt to be most appropriate for this patient at this time.  The patient did not have access to video technology/had technical difficulties with video requiring transitioning to audio format only (telephone).  All issues noted in this document were discussed and addressed.  No physical exam could be performed with this format.    Evaluation Performed:  Follow-up visit  Date:  10/21/2019   ID:  Jennifer Velasquez, Jennifer Velasquez 1956-08-06, MRN [id number]  Patient Location: Home Provider Location: Office  Location of Patient: Home Location of Provider: Telehealth Consent was obtain for visit to be over via telehealth. I verified that I am speaking with the correct person using two identifiers.  PCP:  Freddy Finner, NP   Chief Complaint:  Post covid follow up   History of Present Illness:    Jennifer Velasquez is a 64 y.o. female with  history of hypertension, diabetes type 2, hyperlipidemia, back pain, depression, obesity among others. Post recovery from COVID 19 infection in Dec.  Overall she is doing well. Only questions are if she can have a chest xray and vaccine from work.  The patient does not have symptoms concerning for COVID-19 infection (fever, chills, cough, or new shortness of breath).   Past Medical, Surgical, Social History, Allergies, and Medications have been Reviewed.  Past Medical History:  Diagnosis Date  . Back pain   . Neck pain    left   . Need for prophylactic hormone replacement therapy (postmenopausal)   . Obesity, unspecified   . Other and unspecified hyperlipidemia   . Routine  general medical examination at a health care facility   . Trichomonal vaginitis   . Type II or unspecified type diabetes mellitus without mention of complication, not stated as uncontrolled   . Unspecified constipation   . Unspecified essential hypertension    Past Surgical History:  Procedure Laterality Date  . APPENDECTOMY    . DENTAL SURGERY    . TOTAL ABDOMINAL HYSTERECTOMY     fibroids     Current Meds  Medication Sig  . acetaminophen (TYLENOL) 500 MG tablet Take 500 mg by mouth every 6 (six) hours as needed for mild pain or moderate pain.  Marland Kitchen glipiZIDE (GLUCOTROL XL) 10 MG 24 hr tablet Take 1 tablet (10 mg total) by mouth daily with breakfast.  . ibuprofen (ADVIL) 800 MG tablet Take 1 tablet (800 mg total) by mouth every 8 (eight) hours as needed for moderate pain.  . metFORMIN (GLUCOPHAGE) 500 MG tablet Take 1 tablet (500 mg total) by mouth 2 (two) times daily with a meal.  . Multiple Vitamin (MULTIVITAMIN WITH MINERALS) TABS tablet Take 1 tablet by mouth daily.  . rosuvastatin (CRESTOR) 5 MG tablet Take 1 tablet (5 mg total) by mouth daily.     Allergies:   Pravastatin sodium   ROS:   Please see the history of present illness.    All other systems reviewed and are negative.   Labs/Other Tests and Data Reviewed:    Recent Labs: 09/30/2019: ALT 24; BUN 14; Creatinine, Ser 1.07; Hemoglobin 14.6; Platelets 260; Potassium 3.9; Sodium 131  Recent Lipid Panel Lab Results  Component Value Date/Time   CHOL 195 08/06/2019 10:53 AM   TRIG 106 08/06/2019 10:53 AM   HDL 40 (L) 08/06/2019 10:53 AM   CHOLHDL 4.9 08/06/2019 10:53 AM   LDLCALC 134 (H) 08/06/2019 10:53 AM    Wt Readings from Last 3 Encounters:  10/21/19 160 lb (72.6 kg)  09/30/19 164 lb (74.4 kg)  07/08/19 165 lb (74.8 kg)     Objective:    Vital Signs:  BP 138/85   Ht 5' 2.5" (1.588 m)   Wt 160 lb (72.6 kg)   BMI 28.80 kg/m    VITAL SIGNS:  reviewed GEN:  alert and oriented  RESPIRATORY:  no  shortness of breath or cough in conversation  PSYCH:  normal affect and mood   ASSESSMENT & PLAN:    1. COVID-19 virus infection  - DG Chest 2 View  2. Educated about COVID-19 virus infection   Time:   Today, I have spent 10 minutes with the patient with telehealth technology discussing the above problems.     Medication Adjustments/Labs and Tests Ordered: Current medicines are reviewed at length with the patient today.  Concerns regarding medicines are outlined above.   Tests Ordered: No orders of the defined types were placed in this encounter.   Medication Changes: No orders of the defined types were placed in this encounter.   Disposition:  Follow up 3 months  Signed, Perlie Mayo, NP  10/21/2019 10:12 AM     [facility]

## 2019-10-21 NOTE — Patient Instructions (Signed)
Happy New Year! May you have a year filled with hope, love, happiness and laughter.  I appreciate the opportunity to provide you with care for your health and wellness. Today we discussed: covid  Follow up: 3 months   No labs or referrals today  Xray of chest at [facility]  Vaccine is recommended  Please continue to practice social distancing to keep you, your family, and our community safe.  If you must go out, please wear a mask and practice good handwashing.  It was a pleasure to see you and I look forward to continuing to work together on your health and well-being. Please do not hesitate to call the office if you need care or have questions about your care.  Have a wonderful day and week. With Gratitude, Tereasa Coop, DNP, AGNP-BC

## 2019-10-21 NOTE — Assessment & Plan Note (Signed)
Resolved

## 2019-10-21 NOTE — Assessment & Plan Note (Signed)
Post education: Encouraged vaccine and chest xray ordered to check lungs. Overall symptom free and feeling much better.  Vaccine is given at her employer.

## 2019-11-22 ENCOUNTER — Other Ambulatory Visit: Payer: Self-pay | Admitting: *Deleted

## 2019-11-22 DIAGNOSIS — E119 Type 2 diabetes mellitus without complications: Secondary | ICD-10-CM

## 2019-11-22 MED ORDER — METFORMIN HCL 500 MG PO TABS: 500.0000 mg | ORAL_TABLET | Freq: Two times a day (BID) | ORAL | 0 refills | Status: DC

## 2020-01-05 ENCOUNTER — Telehealth: Payer: Self-pay

## 2020-01-05 NOTE — Telephone Encounter (Signed)
Tried to call pt to talk with her and find out about the co-pay she said is being charged from [facility].  Her phone is full and not accepting messages.  I will try later.

## 2020-01-18 ENCOUNTER — Telehealth: Payer: Self-pay

## 2020-01-18 NOTE — Telephone Encounter (Signed)
I have sent a message to Belva Bertin and asked her to check on why this patient if being billed a speciality co-pay for her visits with NP Tereasa Coop.  Bjorn Loser will call us back after she researches this.

## 2020-01-19 ENCOUNTER — Other Ambulatory Visit: Payer: Self-pay

## 2020-01-19 ENCOUNTER — Encounter: Payer: Self-pay | Admitting: Family Medicine

## 2020-01-19 ENCOUNTER — Ambulatory Visit: Payer: 59 | Admitting: Family Medicine

## 2020-01-19 VITALS — BP 114/82 | HR 96 | Temp 97.1°F | Resp 15 | Ht 63.0 in | Wt 163.0 lb

## 2020-01-19 DIAGNOSIS — E1169 Type 2 diabetes mellitus with other specified complication: Secondary | ICD-10-CM

## 2020-01-19 DIAGNOSIS — E663 Overweight: Secondary | ICD-10-CM | POA: Diagnosis not present

## 2020-01-19 DIAGNOSIS — E119 Type 2 diabetes mellitus without complications: Secondary | ICD-10-CM

## 2020-01-19 DIAGNOSIS — I10 Essential (primary) hypertension: Secondary | ICD-10-CM | POA: Diagnosis not present

## 2020-01-19 DIAGNOSIS — E785 Hyperlipidemia, unspecified: Secondary | ICD-10-CM

## 2020-01-19 DIAGNOSIS — Z6828 Body mass index (BMI) 28.0-28.9, adult: Secondary | ICD-10-CM

## 2020-01-19 MED ORDER — GLIPIZIDE ER 10 MG PO TB24: 10.0000 mg | ORAL_TABLET | Freq: Every day | ORAL | 5 refills | Status: DC

## 2020-01-19 MED ORDER — ROSUVASTATIN CALCIUM 5 MG PO TABS: 5.0000 mg | ORAL_TABLET | Freq: Every day | ORAL | 5 refills | Status: DC

## 2020-01-19 MED ORDER — METFORMIN HCL 500 MG PO TABS: 500.0000 mg | ORAL_TABLET | Freq: Two times a day (BID) | ORAL | 5 refills | Status: DC

## 2020-01-19 NOTE — Patient Instructions (Addendum)
I appreciate the opportunity to provide you with care for your health and wellness. Today we discussed: blood pressure and diabetes  Follow up: 6 months  Labs today  No referrals   Happy Spring!!   Stay the course, be patient with yourself. Each day is a new day to be a better version of ourselves for ourselves. It helps sometimes to set an alarm on your phone to remind you of your meds  or put by toothbrush so you remember to take them.  Please continue to practice social distancing to keep you, your family, and our community safe.  If you must go out, please wear a mask and practice good handwashing.  It was a pleasure to see you and I look forward to continuing to work together on your health and well-being. Please do not hesitate to call the office if you need care or have questions about your care.  Have a wonderful day and week. With Gratitude, Tereasa Coop, DNP, AGNP-BC

## 2020-01-19 NOTE — Assessment & Plan Note (Signed)
Stable Jennifer Velasquez is re-educated about the importance of exercise daily to help with weight management. A minumum of 30 minutes daily is recommended. Additionally, importance of healthy food choices with portion control discussed.  Wt Readings from Last 3 Encounters:  01/19/20 163 lb (73.9 kg)  10/21/19 160 lb (72.6 kg)  09/30/19 164 lb (74.4 kg)

## 2020-01-19 NOTE — Assessment & Plan Note (Signed)
Will be getting updated labs, and encouraged a well-balanced low-fat diabetic friendly diet.

## 2020-01-19 NOTE — Assessment & Plan Note (Signed)
Jennifer Velasquez is encouraged to check blood sugar daily as directed. Continue current medications.  Encouraged to use alarms to help remind her when to take medications.  Updated labs ordered. Is on statin as well. Educated on importance of maintain a well balanced diabetic friendly diet. Se is reminded the importance of maintaining  good blood sugars,  taking medications as directed, daily foot care, annual eye exams. Additionally educated about keeping good control over blood pressure and cholesterol as well.

## 2020-01-19 NOTE — Progress Notes (Signed)
Subjective:  Patient ID: Jennifer Velasquez, female    DOB: November 23, 1955  Age: 64 y.o. MRN: [id number]  CC:  Chief Complaint  Patient presents with  . Hypertension    3 months follow up       HPI  HPI    Jennifer Velasquez is a 64 year old female patient who presents today to follow-up on hypertension hyperlipidemia and diabetes.  Did not get her labs prior to coming into the office as she reports that she is missed several days of her medication in addition to eating poorly.  She is worried that her labs will be bad and wanted to discuss this before going to get her labs.  She denies having any signs or symptoms of uncontrolled blood pressure.  Denies having any polydipsia, polyuria, polyphagia, hypoglycemic or hyperglycemic events.  Reports in general she was doing okay until she got Covid and she could not eat or taste anything which made it difficult for her eating and then when Covid went away she just started eating everything that she could because she can taste again.  She reports she has been bad about her medications as sometimes she just forgets to take them.  Overall she feels well and has no complaints or issues to discuss today outside of the nervousness getting her labs.  Today patient denies signs and symptoms of COVID 19 infection including fever, chills, cough, shortness of breath, and headache. Past Medical, Surgical, Social History, Allergies, and Medications have been Reviewed.   Past Medical History:  Diagnosis Date  . Back pain   . CARPAL TUNNEL SYNDROME, LEFT 06/02/2008   Qualifier: Diagnosis of  By: Jonna Munro MD, Roderic Scarce    . COVID-19 virus infection 09/29/2019  . DEPRESSION, SITUATIONAL 11/25/2008   Qualifier: Diagnosis of  Problem Stop Reason:  By: Jonna Munro MD, Roderic Scarce    . Educated about COVID-19 virus infection 10/21/2019  . LYMPHADENOPATHY 05/28/2010   Qualifier: Diagnosis of  By: Claybon Jabs PA, Dawn    . Neck pain    left   . Need for prophylactic hormone  replacement therapy (postmenopausal)   . Obesity, unspecified   . Other and unspecified hyperlipidemia   . Routine general medical examination at a health care facility   . Trichomonal vaginitis   . Type II or unspecified type diabetes mellitus without mention of complication, not stated as uncontrolled   . Unspecified constipation   . Unspecified essential hypertension     Current Meds  Medication Sig  . acetaminophen (TYLENOL) 500 MG tablet Take 500 mg by mouth every 6 (six) hours as needed for mild pain or moderate pain.  Marland Kitchen glipiZIDE (GLUCOTROL XL) 10 MG 24 hr tablet Take 1 tablet (10 mg total) by mouth daily with breakfast.  . ibuprofen (ADVIL) 800 MG tablet Take 1 tablet (800 mg total) by mouth every 8 (eight) hours as needed for moderate pain.  . metFORMIN (GLUCOPHAGE) 500 MG tablet Take 1 tablet (500 mg total) by mouth 2 (two) times daily with a meal.  . Multiple Vitamin (MULTIVITAMIN WITH MINERALS) TABS tablet Take 1 tablet by mouth daily.  . rosuvastatin (CRESTOR) 5 MG tablet Take 1 tablet (5 mg total) by mouth daily.  . [DISCONTINUED] glipiZIDE (GLUCOTROL XL) 10 MG 24 hr tablet Take 1 tablet (10 mg total) by mouth daily with breakfast.  . [DISCONTINUED] metFORMIN (GLUCOPHAGE) 500 MG tablet Take 1 tablet (500 mg total) by mouth 2 (two) times daily with a meal.  . [DISCONTINUED] rosuvastatin (CRESTOR)  5 MG tablet Take 1 tablet (5 mg total) by mouth daily.    ROS:  Review of Systems  Constitutional: Negative.   HENT: Negative.   Eyes: Negative.   Respiratory: Negative.   Cardiovascular: Negative.   Gastrointestinal: Negative.   Genitourinary: Negative.   Musculoskeletal: Negative.   Skin: Negative.   Neurological: Negative.   Endo/Heme/Allergies: Negative.   Psychiatric/Behavioral: Negative.   All other systems reviewed and are negative.    Objective:   Today's Vitals: BP 114/82   Pulse 96   Temp (!) 97.1 F (36.2 C) (Temporal)   Resp 15   Ht 5\' 3"  (1.6 m)   Wt  163 lb (73.9 kg)   SpO2 98%   BMI 28.87 kg/m  Vitals with BMI 01/19/2020 10/21/2019 09/30/2019  Height 5\' 3"  5' 2.5" 5\' 2"   Weight 163 lbs 160 lbs 164 lbs  BMI 28.88 28.78 29.99  Systolic 114 138 10/02/2019  Diastolic 82 85 95  Pulse 96 -     Physical Exam Vitals and nursing note reviewed.  Constitutional:      Appearance: Normal appearance. She is well-developed, well-groomed and overweight.  HENT:     Head: Normocephalic and atraumatic.     Right Ear: External ear normal.     Left Ear: External ear normal.     Mouth/Throat:     Comments: Mask in place  Eyes:     General:        Right eye: No discharge.        Left eye: No discharge.     Conjunctiva/sclera: Conjunctivae normal.  Cardiovascular:     Rate and Rhythm: Normal rate and regular rhythm.     Pulses: Normal pulses.     Heart sounds: Normal heart sounds.  Pulmonary:     Effort: Pulmonary effort is normal.     Breath sounds: Normal breath sounds.  Musculoskeletal:        General: Normal range of motion.     Cervical back: Normal range of motion and neck supple.  Skin:    General: Skin is warm.  Neurological:     General: No focal deficit present.     Mental Status: She is alert and oriented to person, place, and time.  Psychiatric:        Attention and Perception: Attention normal.        Mood and Affect: Mood normal.        Speech: Speech normal.        Behavior: Behavior normal. Behavior is cooperative.        Thought Content: Thought content normal.        Cognition and Memory: Cognition normal.        Judgment: Judgment normal.     Assessment   1. Diabetes mellitus without complication (HCC)   2. Hyperlipidemia associated with type 2 diabetes mellitus (HCC)   3. Essential hypertension   4. Overweight with body mass index (BMI) of 28 to 28.9 in adult     Tests ordered No orders of the defined types were placed in this encounter.    Plan: Please see assessment and plan per problem list  above.   Meds ordered this encounter  Medications  . glipiZIDE (GLUCOTROL XL) 10 MG 24 hr tablet    Sig: Take 1 tablet (10 mg total) by mouth daily with breakfast.    Dispense:  30 tablet    Refill:  5  . metFORMIN (GLUCOPHAGE) 500 MG tablet  Sig: Take 1 tablet (500 mg total) by mouth 2 (two) times daily with a meal.    Dispense:  60 tablet    Refill:  5  . rosuvastatin (CRESTOR) 5 MG tablet    Sig: Take 1 tablet (5 mg total) by mouth daily.    Dispense:  30 tablet    Refill:  5    Patient to follow-up in 6 months annual   Freddy Finner, NP

## 2020-01-19 NOTE — Assessment & Plan Note (Signed)
Jennifer Velasquez is encouraged to maintain a well balanced diet that is low in salt. Controlled, continue current medication regimen. Refills provided. Additionally, she is also reminded that exercise is beneficial for heart health and control of  Blood pressure. 30-60 minutes daily is recommended-walking was suggested.

## 2020-01-27 LAB — COMPLETE METABOLIC PANEL WITH GFR
AG Ratio: 1.4 (calc) (ref 1.0–2.5)
ALT: 15 U/L (ref 6–29)
AST: 14 U/L (ref 10–35)
Albumin: 4.3 g/dL (ref 3.6–5.1)
Alkaline phosphatase (APISO): 78 U/L (ref 37–153)
BUN: 8 mg/dL (ref 7–25)
CO2: 28 mmol/L (ref 20–32)
Calcium: 9.5 mg/dL (ref 8.6–10.4)
Chloride: 101 mmol/L (ref 98–110)
Creat: 0.72 mg/dL (ref 0.50–0.99)
GFR, Est African American: 103 mL/min/{1.73_m2} (ref >=60)
GFR, Est Non African American: 88 mL/min/{1.73_m2} (ref >=60)
Globulin: 3 g/dL (calc) (ref 1.9–3.7)
Glucose, Bld: 127 mg/dL — ABNORMAL HIGH (ref 65–99)
Potassium: 4.1 mmol/L (ref 3.5–5.3)
Sodium: 137 mmol/L (ref 135–146)
Total Bilirubin: 0.5 mg/dL (ref 0.2–1.2)
Total Protein: 7.3 g/dL (ref 6.1–8.1)

## 2020-01-27 LAB — LIPID PANEL
Cholesterol: 125 mg/dL (ref <200)
HDL: 40 mg/dL — ABNORMAL LOW (ref >=50)
LDL Cholesterol (Calc): 64 mg/dL (calc)
Non-HDL Cholesterol (Calc): 85 mg/dL (calc) (ref <130)
Total CHOL/HDL Ratio: 3.1 (calc) (ref <5.0)
Triglycerides: 133 mg/dL (ref <150)

## 2020-01-27 LAB — HEMOGLOBIN A1C
Hgb A1c MFr Bld: 11.2 % of total Hgb — ABNORMAL HIGH (ref <5.7)
Mean Plasma Glucose: 275 (calc)
eAG (mmol/L): 15.2 (calc)

## 2020-01-27 LAB — MICROALBUMIN / CREATININE URINE RATIO
Creatinine, Urine: 114 mg/dL (ref 20–275)
Microalb Creat Ratio: 7 mcg/mg creat (ref <30)
Microalb, Ur: 0.8 mg/dL

## 2020-01-28 ENCOUNTER — Other Ambulatory Visit: Payer: Self-pay | Admitting: Family Medicine

## 2020-01-28 DIAGNOSIS — E119 Type 2 diabetes mellitus without complications: Secondary | ICD-10-CM

## 2020-01-28 MED ORDER — METFORMIN HCL 1000 MG PO TABS: 1000.0000 mg | ORAL_TABLET | Freq: Two times a day (BID) | ORAL | 1 refills | Status: DC

## 2020-07-20 ENCOUNTER — Ambulatory Visit (INDEPENDENT_AMBULATORY_CARE_PROVIDER_SITE_OTHER): Payer: BC Managed Care – PPO | Admitting: Family Medicine

## 2020-07-20 ENCOUNTER — Encounter: Payer: Self-pay | Admitting: Family Medicine

## 2020-07-20 ENCOUNTER — Other Ambulatory Visit: Payer: Self-pay

## 2020-07-20 VITALS — BP 131/94 | HR 68 | Temp 97.4°F | Resp 18 | Ht 62.5 in | Wt 164.0 lb

## 2020-07-20 DIAGNOSIS — Z5320 Procedure and treatment not carried out because of patient's decision for unspecified reasons: Secondary | ICD-10-CM

## 2020-07-20 DIAGNOSIS — Z0001 Encounter for general adult medical examination with abnormal findings: Secondary | ICD-10-CM | POA: Insufficient documentation

## 2020-07-20 DIAGNOSIS — E663 Overweight: Secondary | ICD-10-CM

## 2020-07-20 DIAGNOSIS — Z780 Asymptomatic menopausal state: Secondary | ICD-10-CM

## 2020-07-20 DIAGNOSIS — E1165 Type 2 diabetes mellitus with hyperglycemia: Secondary | ICD-10-CM

## 2020-07-20 DIAGNOSIS — Z23 Encounter for immunization: Secondary | ICD-10-CM | POA: Diagnosis not present

## 2020-07-20 DIAGNOSIS — E1169 Type 2 diabetes mellitus with other specified complication: Secondary | ICD-10-CM

## 2020-07-20 DIAGNOSIS — E785 Hyperlipidemia, unspecified: Secondary | ICD-10-CM | POA: Diagnosis not present

## 2020-07-20 DIAGNOSIS — E119 Type 2 diabetes mellitus without complications: Secondary | ICD-10-CM | POA: Diagnosis not present

## 2020-07-20 DIAGNOSIS — I10 Essential (primary) hypertension: Secondary | ICD-10-CM

## 2020-07-20 DIAGNOSIS — G5601 Carpal tunnel syndrome, right upper limb: Secondary | ICD-10-CM

## 2020-07-20 DIAGNOSIS — Z1211 Encounter for screening for malignant neoplasm of colon: Secondary | ICD-10-CM

## 2020-07-20 LAB — POCT GLYCOSYLATED HEMOGLOBIN (HGB A1C)
HbA1c POC (<> result, manual entry): 9.7 % (ref 4.0–5.6)
HbA1c, POC (controlled diabetic range): 9.7 % — AB (ref 0.0–7.0)
HbA1c, POC (prediabetic range): 9.7 % — AB (ref 5.7–6.4)
Hemoglobin A1C: 9.7 % — AB (ref 4.0–5.6)

## 2020-07-20 MED ORDER — GLIPIZIDE 10 MG PO TABS: 10.0000 mg | ORAL_TABLET | Freq: Two times a day (BID) | ORAL | 3 refills | Status: DC

## 2020-07-20 MED ORDER — ROSUVASTATIN CALCIUM 5 MG PO TABS: 5.0000 mg | ORAL_TABLET | Freq: Every day | ORAL | 5 refills | Status: DC

## 2020-07-20 MED ORDER — UNABLE TO FIND: 0 refills | Status: DC

## 2020-07-20 MED ORDER — METFORMIN HCL 1000 MG PO TABS: 1000.0000 mg | ORAL_TABLET | Freq: Two times a day (BID) | ORAL | 1 refills | Status: DC

## 2020-07-20 NOTE — Assessment & Plan Note (Signed)
Bone scan ordered today. 

## 2020-07-20 NOTE — Assessment & Plan Note (Signed)
Jennifer Velasquez is encouraged to check blood sugar daily as directed. Change in current medications- increased glipizide to 10 mg BID continue metformin. Will get labs. Is on statin as well. Needs Ace or BP med. Educated on importance of maintain a well balanced diabetic friendly diet. Foot exam performed today.  She is reminded the importance of maintaining  good blood sugars,  taking medications as directed, daily foot care, annual eye exams. Additionally educated about keeping good control over blood pressure and cholesterol as well.

## 2020-07-20 NOTE — Assessment & Plan Note (Signed)
Brace for now, if no improvement, will refer Patient acknowledged agreement and understanding of the plan.

## 2020-07-20 NOTE — Assessment & Plan Note (Addendum)
Continue on Crestor, unsure if taking as directed. Refills provided Up dated labs ordered Encouraged heart healthy diabetic friendly diet.

## 2020-07-20 NOTE — Progress Notes (Signed)
Health Maintenance reviewed -   Immunization History  Administered Date(s) Administered   Fluad Quad(high Dose 65+) 07/20/2020   H1N1 09/12/2008   Influenza Whole 07/20/2007, 06/30/2008   Influenza,inj,Quad PF,6+ Mos 07/22/2018, 06/28/2019   PPD Test 08/25/2015   Pneumococcal Polysaccharide-23 04/23/2007   Tdap 02/07/2018   Last Pap smear: declined Last mammogram: 07/2019 Last colonoscopy: Cologuard ordered  Last DEXA: Ordered today Dentist: needs to get appt-needs root canal  Ophtho: recent appt-reader glasses Exercise: not currently Smoker: no  Alcohol Use: no  Other doctors caring for patient include:  Patient Care Team: Freddy Finner, NP as PCP - General (Family Medicine)  End of Life Discussion:  Patient does not have a living will and medical power of attorney  Subjective:   HPI  Jennifer Velasquez is a 64 y.o. female who presents for annual comprehensive visit and follow-up on chronic medical conditions.  She has the following concerns: worried about her labs. She has not be eating the best and at times has missed doses of her medications.  She does want to get DM under control, she just has a hard time with diet.   She denies having any sleep trouble.  She denies having any trouble chewing or swallowing.  Does need to have a root canal coming up.  She denies having any changes in bowel or bladder habits.  No blood in urine or stool.  Is willing do Cologuard.  Is not willing to do updated Pap.  Denies having any memory issues.  Denies having any falls or injuries.  Denies having any hearing, vision or skin issues.   Review Of Systems  Review of Systems  Constitutional: Negative.   HENT: Negative.   Eyes: Negative.   Respiratory: Negative.   Cardiovascular: Negative.   Gastrointestinal: Negative.   Endocrine: Negative.   Genitourinary: Negative.   Musculoskeletal: Negative.   Skin: Negative.   Neurological: Negative.   Psychiatric/Behavioral: Negative.      Objective:   PHYSICAL EXAM:  BP (!) 131/94 (BP Location: Right Arm, Patient Position: Sitting, Cuff Size: Normal)    Pulse 68    Temp (!) 97.4 F (36.3 C) (Temporal)    Resp 18    Ht 5' 2.5" (1.588 m)    Wt 164 lb (74.4 kg)    SpO2 100%    BMI 29.52 kg/m   Physical Exam Vitals and nursing note reviewed.  Constitutional:      Appearance: Normal appearance. She is normal weight.  HENT:     Head: Normocephalic.     Right Ear: Tympanic membrane normal.     Left Ear: Tympanic membrane normal.     Nose: Nose normal.     Mouth/Throat:     Mouth: Mucous membranes are moist.     Pharynx: Oropharynx is clear.  Eyes:     Extraocular Movements: Extraocular movements intact.     Conjunctiva/sclera: Conjunctivae normal.     Pupils: Pupils are equal, round, and reactive to light.  Cardiovascular:     Rate and Rhythm: Normal rate and regular rhythm.     Pulses: Normal pulses.          Radial pulses are 2+ on the right side and 2+ on the left side.       Dorsalis pedis pulses are 2+ on the right side and 2+ on the left side.     Heart sounds: Normal heart sounds.  Pulmonary:     Effort: Pulmonary effort is normal.  Breath sounds: Normal breath sounds.  Abdominal:     General: Abdomen is flat. Bowel sounds are normal.     Palpations: Abdomen is soft.  Musculoskeletal:        General: Normal range of motion.     Cervical back: Normal range of motion and neck supple.     Right lower leg: No edema.     Left lower leg: No edema.  Skin:    General: Skin is warm and dry.     Capillary Refill: Capillary refill takes less than 2 seconds.  Neurological:     General: No focal deficit present.     Mental Status: She is alert and oriented to person, place, and time. Mental status is at baseline.     Cranial Nerves: Cranial nerves are intact.     Sensory: Sensation is intact.     Motor: Motor function is intact.     Gait: Gait is intact.     Deep Tendon Reflexes: Reflexes are normal and  symmetric.  Psychiatric:        Attention and Perception: Attention normal.        Mood and Affect: Mood normal.        Behavior: Behavior normal.        Thought Content: Thought content normal.        Judgment: Judgment normal.    Depression Screening  Depression screen [facility] 2/9 07/20/2020 10/21/2019 09/29/2019 07/08/2019 06/24/2019  Decreased Interest 0 0 0 0 0  Down, Depressed, Hopeless 0 0 0 0 0  PHQ - 2 Score 0 0 0 0 0     Falls  Fall Risk  07/20/2020 01/19/2020 10/21/2019 09/29/2019 07/08/2019  Falls in the past year? 0 0 0 0 0  Number falls in past yr: 0 0 0 0 0  Injury with Fall? 0 0 0 0 0  Risk for fall due to : No Fall Risks - - - -  Follow up Falls evaluation completed - - Education provided;Falls evaluation completed;Falls prevention discussed -    Assessment & Plan:   1. Annual visit for general adult medical examination with abnormal findings   2. Type 2 diabetes mellitus with hyperglycemia, without long-term current use of insulin (HCC)   3. Hyperlipidemia associated with type 2 diabetes mellitus (HCC)   4. Overweight (BMI 25.0-29.9)   5. Essential hypertension   6. Post-menopausal   7. Encounter for screening for malignant neoplasm of colon   8. Carpal tunnel syndrome on right   9. Pap smear of cervix declined   10. Need for immunization against influenza     Tests ordered Orders Placed This Encounter  Procedures   DG Bone Density   Flu Vaccine QUAD High Dose(Fluad)   CBC   Comprehensive metabolic panel   Lipid panel   VITAMIN D 25 Hydroxy (Vit-D Deficiency, Fractures)   TSH   Cologuard   POCT glycosylated hemoglobin (Hb A1C)     Plan: Please see assessment and plan per problem list above.   Meds ordered this encounter  Medications   UNABLE TO FIND    Sig: Brace for Right Wrist To be wore during activity and at night    Dispense:  1 each    Refill:  0    Order Specific Question:   Supervising Provider    Answer:   Lodema Hong, MARGARET E  [[id number]]   glipiZIDE (GLUCOTROL) 10 MG tablet    Sig: Take 1 tablet (10 mg total) by mouth  2 (two) times daily before a meal.    Dispense:  60 tablet    Refill:  3    Order Specific Question:   Supervising Provider    Answer:   Lodema Hong, MARGARET E [[id number]]   metFORMIN (GLUCOPHAGE) 1000 MG tablet    Sig: Take 1 tablet (1,000 mg total) by mouth 2 (two) times daily with a meal.    Dispense:  120 tablet    Refill:  1    Order Specific Question:   Supervising Provider    Answer:   Syliva Overman E [[id number]]   rosuvastatin (CRESTOR) 5 MG tablet    Sig: Take 1 tablet (5 mg total) by mouth daily.    Dispense:  30 tablet    Refill:  5    Order Specific Question:   Supervising Provider    Answer:   Kerri Perches [[id number]]    I have personally reviewed: The patient's medical and social history Their use of alcohol, tobacco or illicit drugs Their current medications and supplements The patient's functional ability including ADLs,fall risks, home safety risks, cognitive, and hearing and visual impairment Diet and physical activities Evidence for depression or mood disorders  The patient's weight, height, BMI, and visual acuity have been recorded in the chart.  I have made referrals, counseling, and provided education to the patient based on review of the above and I have provided the patient with a written personalized care plan for preventive services.    Note: This dictation was prepared with Dragon dictation along with smaller phrase technology. Similar sounding words can be transcribed inadequately or may not be corrected upon review. Any transcriptional errors that result from this process are unintentional.      Freddy Finner, NP   07/20/2020

## 2020-07-20 NOTE — Assessment & Plan Note (Signed)

## 2020-07-20 NOTE — Assessment & Plan Note (Signed)
She declined wanting the pap smear updated Reviewed recommendations and possible risk if not performed. She still declined.

## 2020-07-20 NOTE — Assessment & Plan Note (Signed)
Jennifer Velasquez is encouraged to maintain a well balanced diet that is low in salt. Higher end of controlled, does not want medication, advised that being DM it is recommended. Will continue to revisit. Additionally, she is also reminded that exercise is beneficial for heart health and control of  Blood pressure. 30-60 minutes daily is recommended-walking was suggested.

## 2020-07-20 NOTE — Assessment & Plan Note (Signed)
Refuses the colonoscopy, but willing to do the cologuard, this was ordered today.

## 2020-07-20 NOTE — Patient Instructions (Addendum)
  HAPPY FALL!  I appreciate the opportunity to provide you with care for your health and wellness. Today we discussed: overall health  Follow up: 3 months in office for a1c and pna 23 vaccine  Labs-fasting today No referrals today  A1c has improved some 9.7%, would like it at 7.5-8%.  So we will adjust your meds and see if that helps as well as diet adjustments :)  Med changes include starting to take glipizide twice daily. Do not take the current one twice daily as it is a 24 hour pill. I have sent in a new prescription for this.  Please continue to practice social distancing to keep you, your family, and our community safe.  If you must go out, please wear a mask and practice good handwashing.  It was a pleasure to see you and I look forward to continuing to work together on your health and well-being. Please do not hesitate to call the office if you need care or have questions about your care.  Have a wonderful day and week. With Gratitude, Tereasa Coop, DNP, AGNP-BC

## 2020-07-20 NOTE — Assessment & Plan Note (Addendum)
  Discussed monthly self breast exams and yearly mammograms; at least 30 minutes of aerobic activity at least 5 days/week and weight-bearing exercise 2x/week; proper sunscreen use reviewed; healthy diet, including goals of calcium and vitamin D intake and alcohol recommendations (less than or equal to 1 drink/day) reviewed; regular seatbelt use; changing batteries in smoke detectors.  Immunization recommendations discussed.   Colonoscopy recommendations reviewed- refused it, but will do cologuard. Also refusing pap smear.

## 2020-07-20 NOTE — Assessment & Plan Note (Signed)
Deteriorated Jennifer Velasquez is ducated about the importance of exercise daily to help with weight management. A minumum of 30 minutes daily is recommended. Additionally, importance of healthy food choices with portion control discussed.  Wt Readings from Last 3 Encounters:  07/20/20 164 lb (74.4 kg)  01/19/20 163 lb (73.9 kg)  10/21/19 160 lb (72.6 kg)

## 2020-07-21 LAB — CBC
Hematocrit: 42.4 % (ref 34.0–46.6)
Hemoglobin: 14.1 g/dL (ref 11.1–15.9)
MCH: 28.1 pg (ref 26.6–33.0)
MCHC: 33.3 g/dL (ref 31.5–35.7)
MCV: 85 fL (ref 79–97)
Platelets: 334 10*3/uL (ref 150–450)
RBC: 5.02 x10E6/uL (ref 3.77–5.28)
RDW: 12.6 % (ref 11.7–15.4)
WBC: 6 10*3/uL (ref 3.4–10.8)

## 2020-07-21 LAB — LIPID PANEL
Chol/HDL Ratio: 4.9 ratio — ABNORMAL HIGH (ref 0.0–4.4)
Cholesterol, Total: 200 mg/dL — ABNORMAL HIGH (ref 100–199)
HDL: 41 mg/dL (ref >39)
LDL Chol Calc (NIH): 128 mg/dL — ABNORMAL HIGH (ref 0–99)
Triglycerides: 174 mg/dL — ABNORMAL HIGH (ref 0–149)
VLDL Cholesterol Cal: 31 mg/dL (ref 5–40)

## 2020-07-21 LAB — VITAMIN D 25 HYDROXY (VIT D DEFICIENCY, FRACTURES): Vit D, 25-Hydroxy: 23 ng/mL — ABNORMAL LOW (ref 30.0–100.0)

## 2020-07-21 LAB — COMPREHENSIVE METABOLIC PANEL
ALT: 18 IU/L (ref 0–32)
AST: 16 IU/L (ref 0–40)
Albumin/Globulin Ratio: 1.8 (ref 1.2–2.2)
Albumin: 4.7 g/dL (ref 3.8–4.8)
Alkaline Phosphatase: 95 IU/L (ref 44–121)
BUN/Creatinine Ratio: 14 (ref 12–28)
BUN: 11 mg/dL (ref 8–27)
Bilirubin Total: 0.4 mg/dL (ref 0.0–1.2)
CO2: 24 mmol/L (ref 20–29)
Calcium: 9.7 mg/dL (ref 8.7–10.3)
Chloride: 99 mmol/L (ref 96–106)
Creatinine, Ser: 0.78 mg/dL (ref 0.57–1.00)
GFR calc Af Amer: 92 mL/min/{1.73_m2} (ref >59)
GFR calc non Af Amer: 80 mL/min/{1.73_m2} (ref >59)
Globulin, Total: 2.6 g/dL (ref 1.5–4.5)
Glucose: 117 mg/dL — ABNORMAL HIGH (ref 65–99)
Potassium: 4.4 mmol/L (ref 3.5–5.2)
Sodium: 138 mmol/L (ref 134–144)
Total Protein: 7.3 g/dL (ref 6.0–8.5)

## 2020-07-21 LAB — TSH: TSH: 1.29 u[IU]/mL (ref 0.450–4.500)

## 2020-07-24 ENCOUNTER — Other Ambulatory Visit (HOSPITAL_COMMUNITY): Payer: Self-pay | Admitting: Family Medicine

## 2020-07-24 DIAGNOSIS — Z1231 Encounter for screening mammogram for malignant neoplasm of breast: Secondary | ICD-10-CM

## 2020-07-25 ENCOUNTER — Telehealth: Payer: Self-pay | Admitting: *Deleted

## 2020-07-25 ENCOUNTER — Other Ambulatory Visit: Payer: Self-pay | Admitting: Family Medicine

## 2020-07-25 DIAGNOSIS — E559 Vitamin D deficiency, unspecified: Secondary | ICD-10-CM

## 2020-07-25 MED ORDER — VITAMIN D (ERGOCALCIFEROL) 1.25 MG (50000 UNIT) PO CAPS: 50000.0000 [IU] | ORAL_CAPSULE | ORAL | 1 refills | Status: DC

## 2020-07-25 NOTE — Telephone Encounter (Signed)
Pt stated she never received the new glipizide order as she was told to stop taking the 24 hour and start taking 2 also she wanted to know what labs you wanted ordered as she would like for [place] to mail these

## 2020-07-26 ENCOUNTER — Other Ambulatory Visit: Payer: Self-pay | Admitting: Family Medicine

## 2020-07-26 DIAGNOSIS — E1165 Type 2 diabetes mellitus with hyperglycemia: Secondary | ICD-10-CM

## 2020-07-26 MED ORDER — GLIPIZIDE 10 MG PO TABS: 10.0000 mg | ORAL_TABLET | Freq: Two times a day (BID) | ORAL | 3 refills | Status: DC

## 2020-07-26 MED ORDER — LISINOPRIL 10 MG PO TABS: 10.0000 mg | ORAL_TABLET | Freq: Every day | ORAL | 2 refills | Status: DC

## 2020-07-26 NOTE — Telephone Encounter (Signed)
I reviewed the note on labs. I do not want any orders. I sent in BP medication, I reordered the glipizide- it showed sent on 10/7, and I ordered vitamin D. No labs until next appt. Which will only be A1c in office most likely. Thanks for follow up

## 2020-07-26 NOTE — Telephone Encounter (Signed)
Pt notified with verbal understanding  °

## 2020-07-26 NOTE — Telephone Encounter (Signed)
Attempted to call pt with recommendation NA NVM

## 2020-08-01 LAB — COLOGUARD: Cologuard: NEGATIVE

## 2020-08-03 ENCOUNTER — Other Ambulatory Visit: Payer: Self-pay

## 2020-08-03 ENCOUNTER — Ambulatory Visit (HOSPITAL_COMMUNITY)
Admission: RE | Admit: 2020-08-03 | Discharge: 2020-08-03 | Disposition: A | Discharge status: Home / Self Care | Payer: BC Managed Care – PPO | Source: Ambulatory Visit | Attending: Family Medicine | Admitting: Family Medicine

## 2020-08-03 DIAGNOSIS — Z1231 Encounter for screening mammogram for malignant neoplasm of breast: Secondary | ICD-10-CM | POA: Diagnosis not present

## 2020-08-10 LAB — EXTERNAL GENERIC LAB PROCEDURE: COLOGUARD: NEGATIVE

## 2020-08-10 LAB — COLOGUARD
COLOGUARD: NEGATIVE
Cologuard: NEGATIVE

## 2020-08-17 ENCOUNTER — Other Ambulatory Visit (HOSPITAL_COMMUNITY): Payer: BC Managed Care – PPO

## 2020-09-07 ENCOUNTER — Emergency Department (HOSPITAL_COMMUNITY): Payer: BC Managed Care – PPO

## 2020-09-07 ENCOUNTER — Emergency Department (HOSPITAL_COMMUNITY)
Admission: EM | Admit: 2020-09-07 | Discharge: 2020-09-07 | Disposition: A | Discharge status: Home / Self Care | Payer: BC Managed Care – PPO | Attending: Emergency Medicine | Admitting: Emergency Medicine

## 2020-09-07 ENCOUNTER — Encounter (HOSPITAL_COMMUNITY): Payer: Self-pay | Admitting: Emergency Medicine

## 2020-09-07 ENCOUNTER — Other Ambulatory Visit: Payer: Self-pay

## 2020-09-07 DIAGNOSIS — I10 Essential (primary) hypertension: Secondary | ICD-10-CM | POA: Insufficient documentation

## 2020-09-07 DIAGNOSIS — R1030 Lower abdominal pain, unspecified: Secondary | ICD-10-CM | POA: Diagnosis not present

## 2020-09-07 DIAGNOSIS — Z8616 Personal history of COVID-19: Secondary | ICD-10-CM | POA: Insufficient documentation

## 2020-09-07 DIAGNOSIS — Z7984 Long term (current) use of oral hypoglycemic drugs: Secondary | ICD-10-CM | POA: Diagnosis not present

## 2020-09-07 DIAGNOSIS — Z87891 Personal history of nicotine dependence: Secondary | ICD-10-CM | POA: Diagnosis not present

## 2020-09-07 DIAGNOSIS — Z79899 Other long term (current) drug therapy: Secondary | ICD-10-CM | POA: Diagnosis not present

## 2020-09-07 DIAGNOSIS — R109 Unspecified abdominal pain: Secondary | ICD-10-CM | POA: Diagnosis not present

## 2020-09-07 DIAGNOSIS — E1165 Type 2 diabetes mellitus with hyperglycemia: Secondary | ICD-10-CM | POA: Diagnosis not present

## 2020-09-07 DIAGNOSIS — R739 Hyperglycemia, unspecified: Secondary | ICD-10-CM

## 2020-09-07 LAB — URINALYSIS, ROUTINE W REFLEX MICROSCOPIC
Bilirubin Urine: NEGATIVE
Glucose, UA: >=500 mg/dL — AB
Hgb urine dipstick: NEGATIVE
Ketones, ur: NEGATIVE mg/dL
Leukocytes,Ua: NEGATIVE
Nitrite: NEGATIVE
Protein, ur: NEGATIVE mg/dL
Specific Gravity, Urine: 1.016 (ref 1.005–1.030)
pH: 8 (ref 5.0–8.0)

## 2020-09-07 LAB — CBC WITH DIFFERENTIAL/PLATELET
Abs Immature Granulocytes: 0.03 10*3/uL (ref 0.00–0.07)
Basophils Absolute: 0 10*3/uL (ref 0.0–0.1)
Basophils Relative: 0 %
Eosinophils Absolute: 0.1 10*3/uL (ref 0.0–0.5)
Eosinophils Relative: 1 %
HCT: 42.1 % (ref 36.0–46.0)
Hemoglobin: 14 g/dL (ref 12.0–15.0)
Immature Granulocytes: 0 %
Lymphocytes Relative: 25 %
Lymphs Abs: 2.1 10*3/uL (ref 0.7–4.0)
MCH: 29.1 pg (ref 26.0–34.0)
MCHC: 33.3 g/dL (ref 30.0–36.0)
MCV: 87.5 fL (ref 80.0–100.0)
Monocytes Absolute: 0.4 10*3/uL (ref 0.1–1.0)
Monocytes Relative: 5 %
Neutro Abs: 5.7 10*3/uL (ref 1.7–7.7)
Neutrophils Relative %: 69 %
Platelets: 300 10*3/uL (ref 150–400)
RBC: 4.81 MIL/uL (ref 3.87–5.11)
RDW: 12.5 % (ref 11.5–15.5)
WBC: 8.4 10*3/uL (ref 4.0–10.5)
nRBC: 0 % (ref 0.0–0.2)

## 2020-09-07 LAB — COMPREHENSIVE METABOLIC PANEL
ALT: 18 U/L (ref 0–44)
AST: 17 U/L (ref 15–41)
Albumin: 3.9 g/dL (ref 3.5–5.0)
Alkaline Phosphatase: 83 U/L (ref 38–126)
Anion gap: 7 (ref 5–15)
BUN: 11 mg/dL (ref 8–23)
CO2: 28 mmol/L (ref 22–32)
Calcium: 9.4 mg/dL (ref 8.9–10.3)
Chloride: 99 mmol/L (ref 98–111)
Creatinine, Ser: 0.8 mg/dL (ref 0.44–1.00)
GFR, Estimated: >60 mL/min (ref >60)
Glucose, Bld: 319 mg/dL — ABNORMAL HIGH (ref 70–99)
Potassium: 3.8 mmol/L (ref 3.5–5.1)
Sodium: 134 mmol/L — ABNORMAL LOW (ref 135–145)
Total Bilirubin: 0.3 mg/dL (ref 0.3–1.2)
Total Protein: 7.4 g/dL (ref 6.5–8.1)

## 2020-09-07 LAB — LIPASE, BLOOD: Lipase: 24 U/L (ref 11–51)

## 2020-09-07 NOTE — ED Provider Notes (Addendum)
[facility] EMERGENCY DEPARTMENT Provider Note   CSN: [id number] Arrival date & time: 09/07/20  1607     History Chief Complaint  Patient presents with  . Abdominal Pain    Jennifer Velasquez is a 64 y.o. female with a history of type 2 diabetes and hypertension presenting for evaluation of abdominal pain.  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  She denies having any nausea or vomiting, also no history of constipation or diarrhea.  She initially thought she just ate too much so took several Rolaids after which she had multiple episodes of burping, followed by a small but softer than normal nonbloody stool after which her symptoms completely resolved.  She is currently pain-free, no longer feels distention and has no complaints at this time.  She was strongly encouraged by her family to come in and get "checked out".   HPI     Past Medical History:  Diagnosis Date  . Back pain   . CARPAL TUNNEL SYNDROME, LEFT 06/02/2008   Qualifier: Diagnosis of  By: Erby Pian MD, Remi Haggard    . COVID-19 virus infection 09/29/2019  . DEPRESSION, SITUATIONAL 11/25/2008   Qualifier: Diagnosis of  Problem Stop Reason:  By: Erby Pian MD, Remi Haggard    . Educated about COVID-19 virus infection 10/21/2019  . LYMPHADENOPATHY 05/28/2010   Qualifier: Diagnosis of  By: Garnette Czech PA, Dawn    . Neck pain    left   . Need for prophylactic hormone replacement therapy (postmenopausal)   . Obesity, unspecified   . Other and unspecified hyperlipidemia   . Routine general medical examination at a health care facility   . Trichomonal vaginitis   . Type II or unspecified type diabetes mellitus without mention of complication, not stated as uncontrolled   . Unspecified constipation   . Unspecified essential hypertension     Patient Active Problem List    Diagnosis Date Noted  . Annual visit for general adult medical examination with abnormal findings 07/20/2020  . Post-menopausal 07/20/2020  . Encounter for screening for malignant neoplasm of colon 07/20/2020  . Carpal tunnel syndrome on right 07/20/2020  . Pap smear of cervix declined 07/20/2020  . Need for immunization against influenza 07/20/2020  . Vitamin D deficiency 12/01/2011  . Lumbago 07/20/2007  . Type 2 diabetes mellitus with hyperglycemia (HCC) 01/07/2007  . Hyperlipidemia associated with type 2 diabetes mellitus (HCC) 09/11/2006  . Overweight (BMI 25.0-29.9) 09/11/2006  . Essential hypertension 09/11/2006    Past Surgical History:  Procedure Laterality Date  . APPENDECTOMY    . DENTAL SURGERY    . TOTAL ABDOMINAL HYSTERECTOMY     fibroids     OB History   No obstetric history on file.     Family History  Problem Relation Age of Onset  . Pneumonia Mother   . Transient ischemic attack Father   . Hypertension Father   . Cancer Father   . Hypertension Sister   . Hypertension Sister   . Cancer Daughter        breast     Social History   Tobacco Use  . Smoking status: Former Smoker    Types: Cigarettes  . Smokeless tobacco: Never Used  Vaping Use  . Vaping Use: Never used  Substance Use Topics  . Alcohol use: Yes  . Drug use: No    Home  Medications Prior to Admission medications   Medication Sig Start Date End Date Taking? Authorizing Provider  acetaminophen (TYLENOL) 500 MG tablet Take 500 mg by mouth every 6 (six) hours as needed for mild pain or moderate pain.    [provider]  glipiZIDE (GLUCOTROL) 10 MG tablet Take 1 tablet (10 mg total) by mouth 2 (two) times daily before a meal. 07/26/20   Freddy Finner, NP  ibuprofen (ADVIL) 800 MG tablet Take 1 tablet (800 mg total) by mouth every 8 (eight) hours as needed for moderate pain. Patient not taking: Reported on 07/20/2020 09/29/19   Freddy Finner, NP  lisinopril (ZESTRIL) 10 MG  tablet Take 1 tablet (10 mg total) by mouth daily. 07/26/20   Freddy Finner, NP  metFORMIN (GLUCOPHAGE) 1000 MG tablet Take 1 tablet (1,000 mg total) by mouth 2 (two) times daily with a meal. 07/20/20   Freddy Finner, NP  Multiple Vitamin (MULTIVITAMIN WITH MINERALS) TABS tablet Take 1 tablet by mouth daily.    [provider]  rosuvastatin (CRESTOR) 5 MG tablet Take 1 tablet (5 mg total) by mouth daily. 07/20/20   Freddy Finner, NP  TRUE METRIX BLOOD GLUCOSE TEST test strip  10/12/19   [provider]  TRUEplus Lancets 30G MISC  10/12/19   [provider]  UNABLE TO FIND Brace for Right Wrist To be wore during activity and at night 07/20/20   Freddy Finner, NP  Vitamin D, Ergocalciferol, (DRISDOL) 1.25 MG (50000 UNIT) CAPS capsule Take 1 capsule (50,000 Units total) by mouth every 7 (seven) days. 07/25/20   Freddy Finner, NP    Allergies    Pravastatin sodium  Review of Systems   Review of Systems  Constitutional: Negative for chills and fever.  HENT: Negative for congestion and sore throat.   Eyes: Negative.   Respiratory: Negative for chest tightness and shortness of breath.   Cardiovascular: Negative for chest pain.  Gastrointestinal: Positive for abdominal distention and abdominal pain. Negative for constipation, diarrhea, nausea and vomiting.  Genitourinary: Negative.   Musculoskeletal: Negative for arthralgias, joint swelling and neck pain.  Skin: Negative.  Negative for rash and wound.  Neurological: Negative for dizziness, weakness, light-headedness, numbness and headaches.  Psychiatric/Behavioral: Negative.   All other systems reviewed and are negative.   Physical Exam Updated Vital Signs BP (!) 149/99 (BP Location: Right Arm)   Pulse 91   Temp 97.8 F (36.6 C) (Oral)   Resp 20   Ht 5\' 2"  (1.575 m)   Wt 74.8 kg   SpO2 98%   BMI 30.18 kg/m   Physical Exam Vitals and nursing note reviewed.  Constitutional:      Appearance: She is  well-developed.  HENT:     Head: Normocephalic and atraumatic.  Eyes:     Conjunctiva/sclera: Conjunctivae normal.  Cardiovascular:     Rate and Rhythm: Normal rate and regular rhythm.     Heart sounds: Normal heart sounds.  Pulmonary:     Effort: Pulmonary effort is normal.     Breath sounds: Normal breath sounds. No wheezing.  Abdominal:     General: Bowel sounds are normal. There is no distension.     Palpations: Abdomen is soft.     Tenderness: There is no abdominal tenderness. There is no guarding or rebound. Negative signs include Murphy's sign.     Hernia: No hernia is present.  Musculoskeletal:        General: Normal range of  motion.     Cervical back: Normal range of motion.  Skin:    General: Skin is warm and dry.  Neurological:     Mental Status: She is alert.     ED Results / Procedures / Treatments   Labs (all labs ordered are listed, but only abnormal results are displayed) Labs Reviewed  COMPREHENSIVE METABOLIC PANEL - Abnormal; Notable for the following components:      Result Value   Sodium 134 (*)    Glucose, Bld 319 (*)    All other components within normal limits  URINALYSIS, ROUTINE W REFLEX MICROSCOPIC - Abnormal; Notable for the following components:   APPearance CLOUDY (*)    Glucose, UA >=500 (*)    All other components within normal limits  CBC WITH DIFFERENTIAL/PLATELET  LIPASE, BLOOD    EKG None  Radiology DG ABD ACUTE 2+V W 1V CHEST  Result Date: 09/07/2020 CLINICAL DATA:  Abdomen pain EXAM: DG ABDOMEN ACUTE WITH 1 VIEW CHEST COMPARISON:  12/02/2010 FINDINGS: There is no evidence of dilated bowel loops or free intraperitoneal air. No radiopaque calculi or other significant radiographic abnormality is seen. Heart size and mediastinal contours are within normal limits. Both lungs are clear. Moderate stool in the colon. IMPRESSION: Negative abdominal radiographs.  No acute cardiopulmonary disease. Electronically Signed   By: Jasmine Pang  M.D.   On: 09/07/2020 17:48    Procedures Procedures (including critical care time)  Medications Ordered in ED Medications - No data to display  ED Course  I have reviewed the triage vital signs and the nursing notes.  Pertinent labs & imaging results that were available during my care of the patient were reviewed by me and considered in my medical decision making (see chart for details).    MDM Rules/Calculators/A&P                          Patient with episodic lower abdominal pain completely symptom-free while in the ED.  We discussed watchful waiting since her symptoms have resolved versus basic labs and imaging.  She opted for blood work which was normal except for an elevated CBG.  She endorses drinking lots of fruit punch during her meal today which she knows was full of sugar.  She was encouraged to pay close attention to her diet and make sure she is being compliant with her diabetes medications, advise close follow-up with her PCP for any persistently elevated blood sugars.  She has no anion gap here.  She is also abdominal pain-free.  Her acute abdominal series is negative for any obstructive pattern.  She was given strict return precautions if she develops return and persistent worsening of her symptoms.  Her symptoms improved after taking Rolaids, I suspect this was simply intestinal gas and cramping as a source of symptoms. Final Clinical Impression(s) / ED Diagnoses Final diagnoses:  Lower abdominal pain  Hyperglycemia    Rx / DC Orders ED Discharge Orders    None       Victoriano Lain 09/07/20 1938    Burgess Amor, PA-C 09/07/20 1939    Terald Sleeper, MD 09/08/20 1013

## 2020-09-07 NOTE — Discharge Instructions (Addendum)
Your lab tests and your xray imaging is reassuring today.  Your blood glucose level is elevated however at 319, probably due to the Thanksgiving meal you ate prior to arrival - make sure to watch your diet going forward and do not miss your diabetes medications.  Plan recheck by your primary MD or get rechecked here for any return or worsening of your symptoms.

## 2020-09-07 NOTE — ED Triage Notes (Signed)
Pt c/o abdominal pain that began at noon today. Patient states she is no longer in pain at this time after an antacid.

## 2020-10-20 ENCOUNTER — Ambulatory Visit: Payer: BC Managed Care – PPO | Admitting: Family Medicine

## 2020-12-25 ENCOUNTER — Other Ambulatory Visit: Payer: Self-pay

## 2020-12-25 DIAGNOSIS — E559 Vitamin D deficiency, unspecified: Secondary | ICD-10-CM

## 2020-12-25 DIAGNOSIS — E1165 Type 2 diabetes mellitus with hyperglycemia: Secondary | ICD-10-CM

## 2020-12-25 MED ORDER — VITAMIN D (ERGOCALCIFEROL) 1.25 MG (50000 UNIT) PO CAPS: 50000.0000 [IU] | ORAL_CAPSULE | ORAL | 1 refills | Status: DC

## 2020-12-25 MED ORDER — GLIPIZIDE 10 MG PO TABS: 10.0000 mg | ORAL_TABLET | Freq: Two times a day (BID) | ORAL | 3 refills | Status: DC

## 2020-12-25 MED ORDER — LISINOPRIL 10 MG PO TABS
10.0000 mg | ORAL_TABLET | Freq: Every day | ORAL | 2 refills | Status: DC
Start: 2020-12-25 — End: 2021-03-29

## 2021-01-04 ENCOUNTER — Ambulatory Visit (HOSPITAL_COMMUNITY)
Admission: RE | Admit: 2021-01-04 | Discharge: 2021-01-04 | Disposition: A | Discharge status: Home / Self Care | Payer: BC Managed Care – PPO | Source: Ambulatory Visit | Attending: Family Medicine | Admitting: Family Medicine

## 2021-01-04 DIAGNOSIS — Z780 Asymptomatic menopausal state: Secondary | ICD-10-CM | POA: Insufficient documentation

## 2021-01-04 DIAGNOSIS — M8589 Other specified disorders of bone density and structure, multiple sites: Secondary | ICD-10-CM | POA: Diagnosis not present

## 2021-02-13 ENCOUNTER — Other Ambulatory Visit: Payer: Self-pay

## 2021-02-13 DIAGNOSIS — E1165 Type 2 diabetes mellitus with hyperglycemia: Secondary | ICD-10-CM

## 2021-02-13 MED ORDER — METFORMIN HCL 1000 MG PO TABS: 1000.0000 mg | ORAL_TABLET | Freq: Two times a day (BID) | ORAL | 1 refills | Status: DC

## 2021-03-29 ENCOUNTER — Ambulatory Visit: Payer: BC Managed Care – PPO | Admitting: Nurse Practitioner

## 2021-03-29 ENCOUNTER — Encounter: Payer: Self-pay | Admitting: Nurse Practitioner

## 2021-03-29 ENCOUNTER — Other Ambulatory Visit: Payer: Self-pay

## 2021-03-29 VITALS — BP 166/90 | HR 98 | Resp 16 | Ht 62.0 in | Wt 159.4 lb

## 2021-03-29 DIAGNOSIS — Z23 Encounter for immunization: Secondary | ICD-10-CM | POA: Insufficient documentation

## 2021-03-29 DIAGNOSIS — K649 Unspecified hemorrhoids: Secondary | ICD-10-CM

## 2021-03-29 DIAGNOSIS — E785 Hyperlipidemia, unspecified: Secondary | ICD-10-CM

## 2021-03-29 DIAGNOSIS — E1169 Type 2 diabetes mellitus with other specified complication: Secondary | ICD-10-CM

## 2021-03-29 DIAGNOSIS — I10 Essential (primary) hypertension: Secondary | ICD-10-CM

## 2021-03-29 DIAGNOSIS — E1165 Type 2 diabetes mellitus with hyperglycemia: Secondary | ICD-10-CM | POA: Diagnosis not present

## 2021-03-29 MED ORDER — METFORMIN HCL ER 500 MG PO TB24: 1000.0000 mg | ORAL_TABLET | Freq: Two times a day (BID) | ORAL | 1 refills | Status: DC

## 2021-03-29 MED ORDER — LISINOPRIL 20 MG PO TABS: 20.0000 mg | ORAL_TABLET | Freq: Every day | ORAL | 3 refills | Status: DC

## 2021-03-29 NOTE — Assessment & Plan Note (Signed)
Prevnar 13 administered today.

## 2021-03-29 NOTE — Progress Notes (Signed)
Acute Office Visit  Subjective:    Patient ID: Jennifer Velasquez, female    DOB: [birth year], 65 y.o.   MRN: [id number]  Chief Complaint  Patient presents with   Hypertension    Follow up visit- wants referral to dietitian    Blood In Stools    Has a BM last week and afterwards noticed a small amount of blood     Hypertension  Patient is in today for diabetes. She was due for a 63-monthfollow-up in January, but failed to follow-up. She states that metformin has GI side effects, and she doesn't like taking it.    She is concerned about blood in her stool. Had a small amount of red blood in her stool last Friday and had blood when wiping. No incidence of blood since then.  She would like referral to nutritionist to help her make correct meal choices.  Past Medical History:  Diagnosis Date   Back pain    CARPAL TUNNEL SYNDROME, LEFT 06/02/2008   Qualifier: Diagnosis of  By: FJonna MunroMD, Cornelius     COVID-19 virus infection 09/29/2019   DEPRESSION, SITUATIONAL 11/25/2008   Qualifier: Diagnosis of  Problem Stop Reason:  By: FJonna MunroMD, CJulieta Belliniabout COVID-19 virus infection 10/21/2019   LYMPHADENOPATHY 05/28/2010   Qualifier: Diagnosis of  By: SClaybon JabsPA, Dawn     Neck pain    left    Need for prophylactic hormone replacement therapy (postmenopausal)    Obesity, unspecified    Other and unspecified hyperlipidemia    Routine general medical examination at a health care facility    Trichomonal vaginitis    Type II or unspecified type diabetes mellitus without mention of complication, not stated as uncontrolled    Unspecified constipation    Unspecified essential hypertension     Past Surgical History:  Procedure Laterality Date   APPENDECTOMY     DENTAL SURGERY     TOTAL ABDOMINAL HYSTERECTOMY     fibroids    Family History  Problem Relation Age of Onset   Pneumonia Mother    Transient ischemic attack Father    Hypertension Father    Cancer Father     Hypertension Sister    Hypertension Sister    Cancer Daughter        breast     Social History   Socioeconomic History   Marital status: Single    Spouse name: Not on file   Number of children: 2   Years of education: 12th   Highest education level: Not on file  Occupational History   Occupation: MFreight forwarderof group [employer]   Tobacco Use   Smoking status: Former    Pack years: 0.00    Types: Cigarettes   Smokeless tobacco: Never  Vaping Use   Vaping Use: Never used  Substance and Sexual Activity   Alcohol use: Yes   Drug use: No   Sexual activity: Not Currently  Other Topics Concern   Not on file  Social History Narrative   Lives with daughter (Malachi Paradise and granddaughter (Lilia Pro   [place]- [place] 2 grand babies (boys[place]      Enjoys spends time with MLilia Pro reads books, spends time with friend      Diet: bread, pasta, veggies, bacon    Caffeine: frozen coffee, soda -daily   Water: 3 bottles of water      Wears seat belt   Doesn't not wear sunscreen   Smoke  detectors   Report not using phone while driving    Social Determinants of Health   Financial Resource Strain: Not on file  Food Insecurity: Not on file  Transportation Needs: Not on file  Physical Activity: Not on file  Stress: Not on file  Social Connections: Not on file  Intimate Partner Violence: Not on file    Outpatient Medications Prior to Visit  Medication Sig Dispense Refill   acetaminophen (TYLENOL) 500 MG tablet Take 500 mg by mouth every 6 (six) hours as needed for mild pain or moderate pain.     glipiZIDE (GLUCOTROL) 10 MG tablet Take 1 tablet (10 mg total) by mouth 2 (two) times daily before a meal. 60 tablet 3   Multiple Vitamin (MULTIVITAMIN WITH MINERALS) TABS tablet Take 1 tablet by mouth daily.     rosuvastatin (CRESTOR) 5 MG tablet Take 1 tablet (5 mg total) by mouth daily. 30 tablet 5   TRUE METRIX BLOOD GLUCOSE TEST test strip      TRUEplus Lancets 30G MISC      UNABLE TO FIND  Brace for Right Wrist To be wore during activity and at night 1 each 0   Vitamin D, Ergocalciferol, (DRISDOL) 1.25 MG (50000 UNIT) CAPS capsule Take 1 capsule (50,000 Units total) by mouth every 7 (seven) days. 12 capsule 1   lisinopril (ZESTRIL) 10 MG tablet Take 1 tablet (10 mg total) by mouth daily. 30 tablet 2   metFORMIN (GLUCOPHAGE) 1000 MG tablet Take 1 tablet (1,000 mg total) by mouth 2 (two) times daily with a meal. 120 tablet 1   ibuprofen (ADVIL) 800 MG tablet Take 1 tablet (800 mg total) by mouth every 8 (eight) hours as needed for moderate pain. (Patient not taking: Reported on 03/29/2021) 30 tablet 0   No facility-administered medications prior to visit.    Allergies  Allergen Reactions   Pravastatin Sodium     REACTION: sore throat    Review of Systems  Constitutional: Negative.   Respiratory: Negative.    Cardiovascular: Negative.   Gastrointestinal:  Positive for blood in stool.       Per HPI  Endocrine: Negative for polydipsia, polyphagia and polyuria.  Psychiatric/Behavioral: Negative.        Objective:    Physical Exam Constitutional:      Appearance: Normal appearance.  Cardiovascular:     Rate and Rhythm: Normal rate and regular rhythm.     Pulses: Normal pulses.     Heart sounds: Normal heart sounds.  Pulmonary:     Effort: Pulmonary effort is normal.     Breath sounds: Normal breath sounds.  Neurological:     Mental Status: She is alert.  Psychiatric:        Mood and Affect: Mood normal.        Behavior: Behavior normal.        Thought Content: Thought content normal.        Judgment: Judgment normal.    BP (!) 166/90   Pulse 98   Resp 16   Ht '5\' 2"'  (1.575 m)   Wt 159 lb 6.4 oz (72.3 kg)   SpO2 98%   BMI 29.15 kg/m  Wt Readings from Last 3 Encounters:  03/29/21 159 lb 6.4 oz (72.3 kg)  09/07/20 165 lb (74.8 kg)  07/20/20 164 lb (74.4 kg)    Health Maintenance Due  Topic Date Due   COVID-19 Vaccine (1) Never done   Zoster Vaccines-  Shingrix (1 of 2) Never  done   OPHTHALMOLOGY EXAM  03/24/2009   PAP SMEAR-Modifier  10/04/2013   COLONOSCOPY (Pts 45-50yr Insurance coverage will need to be confirmed)  05/27/2019   FOOT EXAM  05/20/2020   PNA vac Low Risk Adult (1 of 2 - PCV13) 05/21/2020   HEMOGLOBIN A1C  01/18/2021    There are no preventive care reminders to display for this patient.   Lab Results  Component Value Date   TSH 1.290 07/20/2020   Lab Results  Component Value Date   WBC 8.4 09/07/2020   HGB 14.0 09/07/2020   HCT 42.1 09/07/2020   MCV 87.5 09/07/2020   PLT 300 09/07/2020   Lab Results  Component Value Date   NA 134 (L) 09/07/2020   K 3.8 09/07/2020   CO2 28 09/07/2020   GLUCOSE 319 (H) 09/07/2020   BUN 11 09/07/2020   CREATININE 0.80 09/07/2020   BILITOT 0.3 09/07/2020   ALKPHOS 83 09/07/2020   AST 17 09/07/2020   ALT 18 09/07/2020   PROT 7.4 09/07/2020   ALBUMIN 3.9 09/07/2020   CALCIUM 9.4 09/07/2020   ANIONGAP 7 09/07/2020   Lab Results  Component Value Date   CHOL 200 (H) 07/20/2020   Lab Results  Component Value Date   HDL 41 07/20/2020   Lab Results  Component Value Date   LDLCALC 128 (H) 07/20/2020   Lab Results  Component Value Date   TRIG 174 (H) 07/20/2020   Lab Results  Component Value Date   CHOLHDL 4.9 (H) 07/20/2020   Lab Results  Component Value Date   HGBA1C 9.7 (A) 07/20/2020   HGBA1C 9.7 07/20/2020   HGBA1C 9.7 (A) 07/20/2020   HGBA1C 9.7 (A) 07/20/2020       Assessment & Plan:   Problem List Items Addressed This Visit       Cardiovascular and Mediastinum   Essential hypertension    BP Readings from Last 3 Encounters:  03/29/21 (!) 166/90  09/07/20 (!) 149/99  07/20/20 (!) 131/94  -increase lisinopril -if elevated at next visit, would consider amlodipine        Relevant Medications   lisinopril (ZESTRIL) 20 MG tablet   Hemorrhoid    -had one episode of red blood per rectum in stool and while wiping last Friday -recommend prep  H cream or suppository -will get CBC with labs and get hemoccult cards -she will return to clinic sooner if no relief with prep H       Relevant Medications   lisinopril (ZESTRIL) 20 MG tablet   Other Relevant Orders   CBC with Differential/Platelet     Endocrine   Type 2 diabetes mellitus with hyperglycemia (HCC) - Primary    -no recent A1c -on lisinopril and rosuvatatin -takes metformin and glipizide, but not consistently -Change metformin to XR formulation in attempt to decrease GI side effects -if GI issues continue, would consider a combo medication vs Rybelsus -referral to dietician -check blood sugar daily, today was 220       Relevant Medications   lisinopril (ZESTRIL) 20 MG tablet   metFORMIN (GLUCOPHAGE-XR) 500 MG 24 hr tablet   Other Relevant Orders   Referral to Nutrition and Diabetes Services   CBC with Differential/Platelet   CMP14+EGFR   Lipid Panel With LDL/HDL Ratio   Hemoglobin A1c   Hyperlipidemia associated with type 2 diabetes mellitus ([place]    -will check lipids with next set of labs       Relevant Medications   lisinopril (ZESTRIL) 20  MG tablet   metFORMIN (GLUCOPHAGE-XR) 500 MG 24 hr tablet   Other Relevant Orders   Lipid Panel With LDL/HDL Ratio     Other   Immunization due    -Prevnar-13 administered today         Meds ordered this encounter  Medications   lisinopril (ZESTRIL) 20 MG tablet    Sig: Take 1 tablet (20 mg total) by mouth daily.    Dispense:  90 tablet    Refill:  3   metFORMIN (GLUCOPHAGE-XR) 500 MG 24 hr tablet    Sig: Take 2 tablets (1,000 mg total) by mouth in the morning and at bedtime.    Dispense:  360 tablet    Refill:  Bouse, NP

## 2021-03-29 NOTE — Patient Instructions (Signed)
Please complete hemoccult cards x3 so we can check for internal bleeding.  Please have fasting labs drawn 2-3 days prior to your appointment so we can discuss the results during your office visit.

## 2021-03-29 NOTE — Assessment & Plan Note (Addendum)
-  no recent A1c -on lisinopril and rosuvatatin -takes metformin and glipizide, but not consistently -Change metformin to XR formulation in attempt to decrease GI side effects -if GI issues continue, would consider a combo medication vs Rybelsus -referral to dietician -check blood sugar daily, today was 220

## 2021-03-29 NOTE — Assessment & Plan Note (Addendum)
BP Readings from Last 3 Encounters:  03/29/21 (!) 166/90  09/07/20 (!) 149/99  07/20/20 (!) 131/94  -increase lisinopril -if elevated at next visit, would consider amlodipine

## 2021-03-29 NOTE — Assessment & Plan Note (Signed)
-  had one episode of red blood per rectum in stool and while wiping last Friday -recommend prep H cream or suppository -will get CBC with labs and get hemoccult cards -she will return to clinic sooner if no relief with prep H

## 2021-03-29 NOTE — Assessment & Plan Note (Signed)
-  will check lipids with next set of labs 

## 2021-03-30 NOTE — Addendum Note (Signed)
Addended by: Jerilynn Mages on: 03/30/2021 11:47 AM   Modules accepted: Orders

## 2021-05-10 ENCOUNTER — Ambulatory Visit: Payer: BC Managed Care – PPO | Admitting: Nurse Practitioner

## 2021-05-17 ENCOUNTER — Ambulatory Visit: Payer: BC Managed Care – PPO | Admitting: Nutrition

## 2021-05-25 ENCOUNTER — Ambulatory Visit: Payer: BC Managed Care – PPO | Admitting: Nurse Practitioner

## 2021-06-04 DIAGNOSIS — E1169 Type 2 diabetes mellitus with other specified complication: Secondary | ICD-10-CM | POA: Diagnosis not present

## 2021-06-04 DIAGNOSIS — E1165 Type 2 diabetes mellitus with hyperglycemia: Secondary | ICD-10-CM | POA: Diagnosis not present

## 2021-06-04 DIAGNOSIS — E785 Hyperlipidemia, unspecified: Secondary | ICD-10-CM | POA: Diagnosis not present

## 2021-06-04 DIAGNOSIS — K649 Unspecified hemorrhoids: Secondary | ICD-10-CM | POA: Diagnosis not present

## 2021-06-05 LAB — CBC WITH DIFFERENTIAL/PLATELET
Basophils Absolute: 0 10*3/uL (ref 0.0–0.2)
Basos: 0 %
EOS (ABSOLUTE): 0.1 10*3/uL (ref 0.0–0.4)
Eos: 2 %
Hematocrit: 38 % (ref 34.0–46.6)
Hemoglobin: 12.9 g/dL (ref 11.1–15.9)
Immature Grans (Abs): 0 10*3/uL (ref 0.0–0.1)
Immature Granulocytes: 0 %
Lymphocytes Absolute: 3.6 10*3/uL — ABNORMAL HIGH (ref 0.7–3.1)
Lymphs: 53 %
MCH: 28.9 pg (ref 26.6–33.0)
MCHC: 33.9 g/dL (ref 31.5–35.7)
MCV: 85 fL (ref 79–97)
Monocytes Absolute: 0.5 10*3/uL (ref 0.1–0.9)
Monocytes: 7 %
Neutrophils Absolute: 2.6 10*3/uL (ref 1.4–7.0)
Neutrophils: 38 %
Platelets: 300 10*3/uL (ref 150–450)
RBC: 4.46 x10E6/uL (ref 3.77–5.28)
RDW: 12.5 % (ref 11.7–15.4)
WBC: 6.8 10*3/uL (ref 3.4–10.8)

## 2021-06-05 LAB — CMP14+EGFR
ALT: 12 IU/L (ref 0–32)
AST: 14 IU/L (ref 0–40)
Albumin/Globulin Ratio: 1.6 (ref 1.2–2.2)
Albumin: 4.2 g/dL (ref 3.8–4.8)
Alkaline Phosphatase: 89 IU/L (ref 44–121)
BUN/Creatinine Ratio: 13 (ref 12–28)
BUN: 10 mg/dL (ref 8–27)
Bilirubin Total: 0.5 mg/dL (ref 0.0–1.2)
CO2: 23 mmol/L (ref 20–29)
Calcium: 9.7 mg/dL (ref 8.7–10.3)
Chloride: 99 mmol/L (ref 96–106)
Creatinine, Ser: 0.77 mg/dL (ref 0.57–1.00)
Globulin, Total: 2.6 g/dL (ref 1.5–4.5)
Glucose: 184 mg/dL — ABNORMAL HIGH (ref 65–99)
Potassium: 4.2 mmol/L (ref 3.5–5.2)
Sodium: 136 mmol/L (ref 134–144)
Total Protein: 6.8 g/dL (ref 6.0–8.5)
eGFR: 85 mL/min/{1.73_m2} (ref >59)

## 2021-06-05 LAB — LIPID PANEL WITH LDL/HDL RATIO
Cholesterol, Total: 114 mg/dL (ref 100–199)
HDL: 41 mg/dL (ref >39)
LDL Chol Calc (NIH): 54 mg/dL (ref 0–99)
LDL/HDL Ratio: 1.3 ratio (ref 0.0–3.2)
Triglycerides: 100 mg/dL (ref 0–149)
VLDL Cholesterol Cal: 19 mg/dL (ref 5–40)

## 2021-06-05 LAB — HEMOGLOBIN A1C
Est. average glucose Bld gHb Est-mCnc: 286 mg/dL
Hgb A1c MFr Bld: 11.6 % — ABNORMAL HIGH (ref 4.8–5.6)

## 2021-06-05 NOTE — Progress Notes (Signed)
A1c was 11.6, which is extremely elevated. Cholesterol has improved. We will discuss med changes at her appointment.

## 2021-06-08 ENCOUNTER — Ambulatory Visit: Payer: BC Managed Care – PPO | Admitting: Nurse Practitioner

## 2021-06-08 ENCOUNTER — Encounter: Payer: Self-pay | Admitting: Nurse Practitioner

## 2021-06-08 ENCOUNTER — Other Ambulatory Visit: Payer: Self-pay

## 2021-06-08 VITALS — BP 133/82 | HR 92 | Ht 62.5 in | Wt 158.0 lb

## 2021-06-08 DIAGNOSIS — E1165 Type 2 diabetes mellitus with hyperglycemia: Secondary | ICD-10-CM

## 2021-06-08 DIAGNOSIS — I10 Essential (primary) hypertension: Secondary | ICD-10-CM | POA: Diagnosis not present

## 2021-06-08 DIAGNOSIS — E785 Hyperlipidemia, unspecified: Secondary | ICD-10-CM

## 2021-06-08 DIAGNOSIS — E559 Vitamin D deficiency, unspecified: Secondary | ICD-10-CM

## 2021-06-08 DIAGNOSIS — E1169 Type 2 diabetes mellitus with other specified complication: Secondary | ICD-10-CM

## 2021-06-08 DIAGNOSIS — K649 Unspecified hemorrhoids: Secondary | ICD-10-CM

## 2021-06-08 MED ORDER — TIRZEPATIDE 2.5 MG/0.5ML ~~LOC~~ SOAJ: 2.5000 mg | SUBCUTANEOUS | 0 refills | Status: DC

## 2021-06-08 MED ORDER — TIRZEPATIDE 5 MG/0.5ML ~~LOC~~ SOAJ: 5.0000 mg | SUBCUTANEOUS | 0 refills | Status: DC

## 2021-06-08 NOTE — Assessment & Plan Note (Signed)
BP Readings from Last 3 Encounters:  06/08/21 133/82  03/29/21 (!) 166/90  09/07/20 (!) 149/99   -well controlled; continue current meds

## 2021-06-08 NOTE — Assessment & Plan Note (Signed)
Lab Results  Component Value Date   CHOL 114 06/04/2021   HDL 41 06/04/2021   LDLCALC 54 06/04/2021   TRIG 100 06/04/2021   CHOLHDL 4.9 (H) 07/20/2020   -LDL goal < 70, so she is at goal

## 2021-06-08 NOTE — Patient Instructions (Addendum)
Please bring blood sugar logs to your appointment in 1 month. Check your blood sugar 2-3 times per week.  We will have a lab follow-up in 3 months. Please have fasting labs drawn 2-3 days prior to your appointment so we can discuss the results during your office visit.

## 2021-06-08 NOTE — Progress Notes (Signed)
Acute Office Visit  Subjective:    Patient ID: Jennifer Velasquez, female    DOB: May 25, 1956, 65 y.o.   MRN: [id number]  Chief Complaint  Patient presents with   Diabetes    Follow up    Diabetes  Patient is in today for lab follow-up.  Past Medical History:  Diagnosis Date   Back pain    CARPAL TUNNEL SYNDROME, LEFT 06/02/2008   Qualifier: Diagnosis of  By: Jonna Munro MD, Cornelius     COVID-19 virus infection 09/29/2019   DEPRESSION, SITUATIONAL 11/25/2008   Qualifier: Diagnosis of  Problem Stop Reason:  By: Jonna Munro MD, Julieta Bellini about COVID-19 virus infection 10/21/2019   LYMPHADENOPATHY 05/28/2010   Qualifier: Diagnosis of  By: Claybon Jabs PA, Dawn     Neck pain    left    Need for prophylactic hormone replacement therapy (postmenopausal)    Obesity, unspecified    Other and unspecified hyperlipidemia    Routine general medical examination at a health care facility    Trichomonal vaginitis    Type II or unspecified type diabetes mellitus without mention of complication, not stated as uncontrolled    Unspecified constipation    Unspecified essential hypertension     Past Surgical History:  Procedure Laterality Date   APPENDECTOMY     DENTAL SURGERY     TOTAL ABDOMINAL HYSTERECTOMY     fibroids    Family History  Problem Relation Age of Onset   Pneumonia Mother    Transient ischemic attack Father    Hypertension Father    Cancer Father    Hypertension Sister    Hypertension Sister    Cancer Daughter        breast     Social History   Socioeconomic History   Marital status: Single    Spouse name: Not on file   Number of children: 2   Years of education: 12th   Highest education level: Not on file  Occupational History   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[employer]   Tobacco Use   Smoking status: Former    Types: Cigarettes   Smokeless tobacco: Never  Vaping Use   Vaping Use: Never used  Substance and Sexual Activity   Alcohol use: Yes   Drug use: No    Sexual activity: Not Currently  Other Topics Concern   Not on file  Social History Narrative   Lives with daughter Malachi Paradise) and granddaughter Lilia Pro)   Son (antwan- lives in [place]) 2 grand babies (boys)      Enjoys spends time with Lilia Pro, reads books, spends time with friend      Diet: bread, pasta, veggies, bacon    Caffeine: frozen coffee, soda -daily   Water: 3 bottles of water      Wears seat belt   Doesn't not wear sunscreen   Smoke detectors   Report not using phone while driving    Social Determinants of Health   Financial Resource Strain: Not on file  Food Insecurity: Not on file  Transportation Needs: Not on file  Physical Activity: Not on file  Stress: Not on file  Social Connections: Not on file  Intimate Partner Violence: Not on file    Outpatient Medications Prior to Visit  Medication Sig Dispense Refill   acetaminophen (TYLENOL) 500 MG tablet Take 500 mg by mouth every 6 (six) hours as needed for mild pain or moderate pain.     ibuprofen (ADVIL) 800 MG tablet Take 1  tablet (800 mg total) by mouth every 8 (eight) hours as needed for moderate pain. 30 tablet 0   lisinopril (ZESTRIL) 20 MG tablet Take 1 tablet (20 mg total) by mouth daily. 90 tablet 3   metFORMIN (GLUCOPHAGE-XR) 500 MG 24 hr tablet Take 2 tablets (1,000 mg total) by mouth in the morning and at bedtime. 360 tablet 1   Multiple Vitamin (MULTIVITAMIN WITH MINERALS) TABS tablet Take 1 tablet by mouth daily.     rosuvastatin (CRESTOR) 5 MG tablet Take 1 tablet (5 mg total) by mouth daily. 30 tablet 5   TRUE METRIX BLOOD GLUCOSE TEST test strip      TRUEplus Lancets 30G MISC      glipiZIDE (GLUCOTROL) 10 MG tablet Take 1 tablet (10 mg total) by mouth 2 (two) times daily before a meal. (Patient not taking: Reported on 06/08/2021) 60 tablet 3   Vitamin D, Ergocalciferol, (DRISDOL) 1.25 MG (50000 UNIT) CAPS capsule Take 1 capsule (50,000 Units total) by mouth every 7 (seven) days. (Patient not taking:  Reported on 06/08/2021) 12 capsule 1   UNABLE TO FIND Brace for Right Wrist To be wore during activity and at night (Patient not taking: Reported on 06/08/2021) 1 each 0   No facility-administered medications prior to visit.    Allergies  Allergen Reactions   Pravastatin Sodium     REACTION: sore throat    Review of Systems  Constitutional: Negative.   Respiratory: Negative.    Cardiovascular: Negative.   Musculoskeletal: Negative.   Psychiatric/Behavioral: Negative.        Objective:    Physical Exam Constitutional:      Appearance: Normal appearance.  Cardiovascular:     Rate and Rhythm: Normal rate and regular rhythm.     Pulses: Normal pulses.     Heart sounds: Normal heart sounds.  Pulmonary:     Effort: Pulmonary effort is normal.     Breath sounds: Normal breath sounds.  Musculoskeletal:        General: Normal range of motion.  Neurological:     Mental Status: She is alert.  Psychiatric:        Mood and Affect: Mood normal.        Behavior: Behavior normal.        Thought Content: Thought content normal.        Judgment: Judgment normal.    BP 133/82 (BP Location: Left Arm, Patient Position: Sitting, Cuff Size: Large)   Pulse 92   Ht 5' 2.5" (1.588 m)   Wt 158 lb (71.7 kg)   SpO2 96%   BMI 28.44 kg/m  Wt Readings from Last 3 Encounters:  06/08/21 158 lb (71.7 kg)  03/29/21 159 lb 6.4 oz (72.3 kg)  09/07/20 165 lb (74.8 kg)    Health Maintenance Due  Topic Date Due   Zoster Vaccines- Shingrix (1 of 2) Never done   OPHTHALMOLOGY EXAM  03/24/2009   COLONOSCOPY (Pts 45-8yr Insurance coverage will need to be confirmed)  05/27/2019   FOOT EXAM  05/20/2020   INFLUENZA VACCINE  05/14/2021    There are no preventive care reminders to display for this patient.   Lab Results  Component Value Date   TSH 1.290 07/20/2020   Lab Results  Component Value Date   WBC 6.8 06/04/2021   HGB 12.9 06/04/2021   HCT 38.0 06/04/2021   MCV 85 06/04/2021    PLT 300 06/04/2021   Lab Results  Component Value Date   NA 136  06/04/2021   K 4.2 06/04/2021   CO2 23 06/04/2021   GLUCOSE 184 (H) 06/04/2021   BUN 10 06/04/2021   CREATININE 0.77 06/04/2021   BILITOT 0.5 06/04/2021   ALKPHOS 89 06/04/2021   AST 14 06/04/2021   ALT 12 06/04/2021   PROT 6.8 06/04/2021   ALBUMIN 4.2 06/04/2021   CALCIUM 9.7 06/04/2021   ANIONGAP 7 09/07/2020   EGFR 85 06/04/2021   Lab Results  Component Value Date   CHOL 114 06/04/2021   Lab Results  Component Value Date   HDL 41 06/04/2021   Lab Results  Component Value Date   LDLCALC 54 06/04/2021   Lab Results  Component Value Date   TRIG 100 06/04/2021   Lab Results  Component Value Date   CHOLHDL 4.9 (H) 07/20/2020   Lab Results  Component Value Date   HGBA1C 11.6 (H) 06/04/2021       Assessment & Plan:   Problem List Items Addressed This Visit       Cardiovascular and Mediastinum   Essential hypertension    BP Readings from Last 3 Encounters:  06/08/21 133/82  03/29/21 (!) 166/90  09/07/20 (!) 149/99  -well controlled; continue current meds      Relevant Orders   CBC with Differential/Platelet   CMP14+EGFR   Lipid Panel With LDL/HDL Ratio   Hemorrhoid    -didn't return hemoccult cards -CBC is WNL, so no further work-up today        Endocrine   Type 2 diabetes mellitus with hyperglycemia (HCC)    Lab Results  Component Value Date   HGBA1C 11.6 (H) 06/04/2021  -taking metformin, but she stopped taking glipizide d/t low blood sugars -on statin and ARB -Rx. Mounjaro; reviewed injection procedure with demo pen and patient is confident that she can use it      Relevant Medications   tirzepatide [facility]) 2.5 MG/0.5ML Pen   tirzepatide [facility]) 5 MG/0.5ML Pen (Start on 07/06/2021)   Other Relevant Orders   Hemoglobin A1c   Microalbumin / creatinine urine ratio   Hyperlipidemia associated with type 2 diabetes mellitus ([place])    Lab Results  Component Value Date    CHOL 114 06/04/2021   HDL 41 06/04/2021   LDLCALC 54 06/04/2021   TRIG 100 06/04/2021   CHOLHDL 4.9 (H) 07/20/2020  -LDL goal < 70, so she is at goal      Relevant Medications   tirzepatide [facility]) 2.5 MG/0.5ML Pen   tirzepatide [facility]) 5 MG/0.5ML Pen (Start on 07/06/2021)   Other Relevant Orders   Lipid Panel With LDL/HDL Ratio   Other Visit Diagnoses     Vitamin D deficiency    -  Primary   Relevant Orders   VITAMIN D 25 Hydroxy (Vit-D Deficiency, Fractures)        Meds ordered this encounter  Medications   tirzepatide (MOUNJARO) 2.5 MG/0.5ML Pen    Sig: Inject 2.5 mg into the skin once a week.    Dispense:  2 mL    Refill:  0   tirzepatide (MOUNJARO) 5 MG/0.5ML Pen    Sig: Inject 5 mg into the skin once a week.    Dispense:  6 mL    Refill:  0     Noreene Larsson, NP

## 2021-06-08 NOTE — Assessment & Plan Note (Addendum)
-  didn't return hemoccult cards -CBC is WNL, so no further work-up today

## 2021-06-08 NOTE — Assessment & Plan Note (Addendum)
Lab Results  Component Value Date   HGBA1C 11.6 (H) 06/04/2021  -taking metformin, but she stopped taking glipizide d/t low blood sugars -on statin and ARB -Rx. Mounjaro; reviewed injection procedure with demo pen and patient is confident that she can use it

## 2021-06-08 NOTE — Assessment & Plan Note (Signed)
-  check vit D with next set of labs 

## 2021-06-26 ENCOUNTER — Other Ambulatory Visit: Payer: Self-pay

## 2021-06-26 DIAGNOSIS — E1169 Type 2 diabetes mellitus with other specified complication: Secondary | ICD-10-CM

## 2021-06-26 DIAGNOSIS — E785 Hyperlipidemia, unspecified: Secondary | ICD-10-CM

## 2021-06-26 DIAGNOSIS — E559 Vitamin D deficiency, unspecified: Secondary | ICD-10-CM

## 2021-06-26 MED ORDER — ROSUVASTATIN CALCIUM 5 MG PO TABS: 5.0000 mg | ORAL_TABLET | Freq: Every day | ORAL | 5 refills | Status: DC

## 2021-06-26 MED ORDER — VITAMIN D (ERGOCALCIFEROL) 1.25 MG (50000 UNIT) PO CAPS: 50000.0000 [IU] | ORAL_CAPSULE | ORAL | 1 refills | Status: DC

## 2021-07-19 ENCOUNTER — Ambulatory Visit: Payer: BC Managed Care – PPO | Admitting: Nurse Practitioner

## 2021-08-09 ENCOUNTER — Other Ambulatory Visit (HOSPITAL_COMMUNITY): Payer: Self-pay | Admitting: Nurse Practitioner

## 2021-08-09 DIAGNOSIS — Z1231 Encounter for screening mammogram for malignant neoplasm of breast: Secondary | ICD-10-CM

## 2021-08-11 ENCOUNTER — Other Ambulatory Visit: Payer: Self-pay | Admitting: Nurse Practitioner

## 2021-08-11 DIAGNOSIS — E1165 Type 2 diabetes mellitus with hyperglycemia: Secondary | ICD-10-CM

## 2021-08-23 ENCOUNTER — Ambulatory Visit (HOSPITAL_COMMUNITY): Payer: BC Managed Care – PPO

## 2021-08-27 ENCOUNTER — Other Ambulatory Visit: Payer: Self-pay

## 2021-08-27 ENCOUNTER — Ambulatory Visit (HOSPITAL_COMMUNITY)
Admission: RE | Admit: 2021-08-27 | Discharge: 2021-08-27 | Disposition: A | Discharge status: Home / Self Care | Payer: BC Managed Care – PPO | Source: Ambulatory Visit | Attending: Nurse Practitioner | Admitting: Nurse Practitioner

## 2021-08-27 DIAGNOSIS — Z1231 Encounter for screening mammogram for malignant neoplasm of breast: Secondary | ICD-10-CM | POA: Diagnosis not present

## 2021-09-07 ENCOUNTER — Ambulatory Visit: Payer: BC Managed Care – PPO | Admitting: Nurse Practitioner

## 2021-09-14 ENCOUNTER — Other Ambulatory Visit: Payer: Self-pay | Admitting: *Deleted

## 2021-09-14 ENCOUNTER — Ambulatory Visit: Payer: BC Managed Care – PPO | Admitting: Nurse Practitioner

## 2021-09-14 ENCOUNTER — Encounter: Payer: Self-pay | Admitting: Nurse Practitioner

## 2021-09-14 ENCOUNTER — Other Ambulatory Visit: Payer: Self-pay

## 2021-09-14 VITALS — BP 134/82 | HR 71 | Ht 62.5 in | Wt 159.0 lb

## 2021-09-14 DIAGNOSIS — Z23 Encounter for immunization: Secondary | ICD-10-CM | POA: Diagnosis not present

## 2021-09-14 DIAGNOSIS — I10 Essential (primary) hypertension: Secondary | ICD-10-CM

## 2021-09-14 DIAGNOSIS — E1165 Type 2 diabetes mellitus with hyperglycemia: Secondary | ICD-10-CM | POA: Diagnosis not present

## 2021-09-14 DIAGNOSIS — E663 Overweight: Secondary | ICD-10-CM

## 2021-09-14 DIAGNOSIS — E1169 Type 2 diabetes mellitus with other specified complication: Secondary | ICD-10-CM

## 2021-09-14 DIAGNOSIS — E559 Vitamin D deficiency, unspecified: Secondary | ICD-10-CM

## 2021-09-14 DIAGNOSIS — E785 Hyperlipidemia, unspecified: Secondary | ICD-10-CM

## 2021-09-14 LAB — POCT GLYCOSYLATED HEMOGLOBIN (HGB A1C): Hemoglobin A1C: 10.2 % — AB (ref 4.0–5.6)

## 2021-09-14 MED ORDER — EMPAGLIFLOZIN 10 MG PO TABS: 10.0000 mg | ORAL_TABLET | Freq: Every day | ORAL | 3 refills | Status: DC

## 2021-09-14 MED ORDER — BACITRACIN 500 UNIT/GM EX OINT: 1.0000 "application " | TOPICAL_OINTMENT | Freq: Two times a day (BID) | CUTANEOUS | 0 refills | Status: DC

## 2021-09-14 NOTE — Assessment & Plan Note (Signed)
Check vitamin D level Pt has been taking 50,000 units once a week

## 2021-09-14 NOTE — Assessment & Plan Note (Signed)
start jardiance 10mg  daily Continue metformin 1000mg  BID Regular vigorous exercise 30 minutes 5 times a week Avoid sugar/ sweets, soda, Eat low carb modified diet Lab Results  Component Value Date   HGBA1C 10.2 (A) 09/14/2021

## 2021-09-14 NOTE — Assessment & Plan Note (Addendum)
Lab Results  Component Value Date   HGBA1C 10.2 (A) 09/14/2021   Lab Results  Component Value Date   CHOL 114 06/04/2021   HDL 41 06/04/2021   LDLCALC 54 06/04/2021   TRIG 100 06/04/2021   CHOLHDL 4.9 (H) 07/20/2020  check labs. Pt takes crestor 5mg  daily

## 2021-09-14 NOTE — Assessment & Plan Note (Signed)
BP Readings from Last 3 Encounters:  09/14/21 134/82  06/08/21 133/82  03/29/21 (!) 166/90  continue current med

## 2021-09-14 NOTE — Progress Notes (Signed)
   Jennifer Velasquez     MRN: [id number]      DOB: [birth year]   HPI Jennifer Velasquez is here for follow up and re-evaluation of chronic medical conditions, medication management and review of any available recent lab and radiology data.  Preventive health is updated, specifically  Cancer screening and Immunization.   Questions or concerns regarding consultations or procedures which the PT has had in the interim are  addressed. The PT denies any adverse reactions to current medications since the last visit.  There are no new concerns.   Pt of rash on the left lower inner leg noted one month a ago. The site was red and itchy but itching as improved. She has been cleaning it with peroxide and applying neosporin  ROS Denies recent fever or chills. Denies sinus pressure, nasal congestion, ear pain or sore throat. Denies chest congestion, productive cough or wheezing. Denies chest pains, palpitations and leg swelling Denies abdominal pain, nausea, vomiting,diarrhea or constipation.   Denies dysuria, frequency, hesitancy or incontinence. Denies joint pain, swelling and limitation in mobility. Denies headaches, seizures, numbness, or tingling. Denies depression, anxiety or insomnia. Has rash on left leg   PE  BP (!) 148/88 (BP Location: Left Arm, Patient Position: Sitting, Cuff Size: Normal)   Pulse 71   Ht 5' 2.5" (1.588 m)   Wt 159 lb (72.1 kg)   SpO2 98%   BMI 28.62 kg/m   Patient alert and oriented and in no cardiopulmonary distress.  HEENT: No facial asymmetry, EOMI,     Neck supple .  Chest: Clear to auscultation bilaterally.  CVS: S1, S2 no murmurs, no S3.Regular rate.  ABD: Soft non tender.   Ext: No edema  MS: Adequate ROM spine, shoulders, hips and knees.  Skin: no ulcerations, healing scab noted on left leg, no redness, no skin drainage  Psych: Good eye contact, normal affect. Memory intact not anxious or depressed appearing.  CNS: CN 2-12 intact, power,  normal throughout.no  focal deficits noted.   Assessment & Plan

## 2021-09-14 NOTE — Addendum Note (Signed)
Addended by: Claiborne Rigg D on: 09/14/2021 02:27 PM   Modules accepted: Orders

## 2021-09-14 NOTE — Progress Notes (Signed)
ERROR

## 2021-09-14 NOTE — Assessment & Plan Note (Signed)
engageEat a healthy diet, including lots of fruits and vegetables. Avoid foods with a lot of saturated and trans fats, such as red meat, butter, fried foods and cheese . Maintain a healthy weight. in regular vigorous exercise 30 minutes 5 times a week.

## 2021-09-14 NOTE — Patient Instructions (Signed)
Please get your fasting labs done on Monday. Get your shingles vaccine at your pharmacy. You have been referred for Diabetic education class You have been scheduled for a diabetic eye exam   It is important that you exercise regularly at least 30 minutes 5 times a week.  Think about what you will eat, plan ahead. Choose " clean, green, fresh or frozen" over canned, processed or packaged foods which are more sugary, salty and fatty. 70 to 75% of food eaten should be vegetables and fruit. Three meals at set times with snacks allowed between meals, but they must be fruit or vegetables. Aim to eat over a 12 hour period , example 7 am to 7 pm, and STOP after  your last meal of the day. Drink water,generally about 64 ounces per day, no other drink is as healthy. Fruit juice is best enjoyed in a healthy way, by EATING the fruit.  Thanks for choosing [facility], we consider it a privelige to serve you.

## 2021-09-19 NOTE — Progress Notes (Signed)
Negative mammogram  Repeat in 1 year

## 2021-10-11 ENCOUNTER — Ambulatory Visit: Payer: BC Managed Care – PPO

## 2021-10-19 ENCOUNTER — Other Ambulatory Visit: Payer: Self-pay | Admitting: Nurse Practitioner

## 2021-10-19 DIAGNOSIS — I10 Essential (primary) hypertension: Secondary | ICD-10-CM

## 2021-12-13 ENCOUNTER — Encounter (INDEPENDENT_AMBULATORY_CARE_PROVIDER_SITE_OTHER): Payer: Self-pay

## 2021-12-13 ENCOUNTER — Other Ambulatory Visit: Payer: Self-pay

## 2021-12-13 ENCOUNTER — Encounter: Payer: Self-pay | Admitting: Nurse Practitioner

## 2021-12-13 ENCOUNTER — Ambulatory Visit: Payer: BC Managed Care – PPO | Admitting: Nurse Practitioner

## 2021-12-13 VITALS — BP 129/84 | HR 82 | Ht 62.0 in | Wt 157.0 lb

## 2021-12-13 DIAGNOSIS — E1169 Type 2 diabetes mellitus with other specified complication: Secondary | ICD-10-CM | POA: Diagnosis not present

## 2021-12-13 DIAGNOSIS — Z23 Encounter for immunization: Secondary | ICD-10-CM | POA: Diagnosis not present

## 2021-12-13 DIAGNOSIS — M545 Low back pain, unspecified: Secondary | ICD-10-CM

## 2021-12-13 DIAGNOSIS — I10 Essential (primary) hypertension: Secondary | ICD-10-CM | POA: Diagnosis not present

## 2021-12-13 DIAGNOSIS — L309 Dermatitis, unspecified: Secondary | ICD-10-CM

## 2021-12-13 DIAGNOSIS — L209 Atopic dermatitis, unspecified: Secondary | ICD-10-CM | POA: Insufficient documentation

## 2021-12-13 DIAGNOSIS — E1165 Type 2 diabetes mellitus with hyperglycemia: Secondary | ICD-10-CM | POA: Diagnosis not present

## 2021-12-13 DIAGNOSIS — E785 Hyperlipidemia, unspecified: Secondary | ICD-10-CM

## 2021-12-13 DIAGNOSIS — L2089 Other atopic dermatitis: Secondary | ICD-10-CM

## 2021-12-13 MED ORDER — TRIAMCINOLONE ACETONIDE 0.1 % EX CREA: 1.0000 "application " | TOPICAL_CREAM | Freq: Two times a day (BID) | CUTANEOUS | 0 refills | Status: DC

## 2021-12-13 MED ORDER — METFORMIN HCL ER 500 MG PO TB24: 2000.0000 mg | ORAL_TABLET | Freq: Every day | ORAL | 0 refills | Status: DC

## 2021-12-13 MED ORDER — IBUPROFEN 800 MG PO TABS: 800.0000 mg | ORAL_TABLET | Freq: Three times a day (TID) | ORAL | 0 refills | Status: DC | PRN

## 2021-12-13 MED ORDER — EMPAGLIFLOZIN 10 MG PO TABS: 10.0000 mg | ORAL_TABLET | Freq: Every day | ORAL | 3 refills | Status: DC

## 2021-12-13 NOTE — Patient Instructions (Addendum)
Start taking metformin 2000 mg daily , restart jardiance 10 mg daily .  ?Use kenalog  0.1% cream two times daily for the dermatitis on your left leg  ? ? ?It is important that you exercise regularly at least 30 minutes 5 times a week.  ?Think about what you will eat, plan ahead. ?Choose " clean, green, fresh or frozen" over canned, processed or packaged foods which are more sugary, salty and fatty. ?70 to 75% of food eaten should be vegetables and fruit. ?Three meals at set times with snacks allowed between meals, but they must be fruit or vegetables. ?Aim to eat over a 12 hour period , example 7 am to 7 pm, and STOP after  your last meal of the day. ?Drink water,generally about 64 ounces per day, no other drink is as healthy. Fruit juice is best enjoyed in a healthy way, by EATING the fruit. ? ?Thanks for choosing [facility], we consider it a privelige to serve you. ? ?

## 2021-12-13 NOTE — Assessment & Plan Note (Addendum)
She has been taking metformin 1000mg  daily and sometimes 2000mg  daily, she used jardiance 10mg  daily for a month and stopped because she had to pay $50 copay. ?Start taking metformin XR 2000 mg daily, restart jardiance 10mg  daily  ?A1C today has upcoming diabetic eye exam. ?Pt does not want injectable DM meds ?Need to adhere to medication regimen discussed with pt , she verbalized understanding  ?Need to increase intake of whole foods including vegetables and proteins, less carb discussed. ?Will increase Jardiance to 20 mg daily if A1c is still not at goal after labs today ?Lab Results  ?Component Value Date  ? HGBA1C 10.2 (A) 09/14/2021  ? ?

## 2021-12-13 NOTE — Assessment & Plan Note (Signed)
Patient educated on CDC recommendation for shingles vaccine. Verbal consent was obtained from the patient, shingles vaccine administered by nurse, no sign of adverse reactions noted at this time. Patient education on arm soreness and use of tylenol or ibuprofen (if safe) for this patient  was discussed. Patient educated on the signs and symptoms of adverse effect and advise to contact the office if they occur.  ?

## 2021-12-13 NOTE — Assessment & Plan Note (Signed)
Med refilled  ?Takes ibuprofen 800 mg 3 times daily as needed ?

## 2021-12-13 NOTE — Assessment & Plan Note (Addendum)
BP Readings from Last 3 Encounters:  ?12/13/21 129/84  ?09/14/21 134/82  ?06/08/21 133/82  ?well controlled ?Continue lisinopril 20mg  daily ?DASH diet and regular exercise advised ?CMP today ?

## 2021-12-13 NOTE — Progress Notes (Signed)
? ?Jennifer Velasquez     MRN: [id number]      DOB: 1956/09/20 ? ? ?HPI ?Jennifer Velasquez with medical history of hypertension, type 2 diabetes, hyperlipidemia vitamin D deficiency is here for follow up for her chronic medical conditions ? ?Pt c/o rashes on her left leg since 4 months ago , started off like a  mosquito bite and she scratched it , site is still itching and discolored.she used bacitracin cream but it did not help ?  ?She has been taking metformin 1000mg  daily and sometimes 2000mg  daily, she used jardiance 10mg  daily for a month and stopped because she had to pay $50 copay. states that she has not been taking crestor faithfully .  ? ? ? ? ?ROS ?Denies recent fever or chills. ?Denies sinus pressure, nasal congestion, ear pain or sore throat. ?Denies chest congestion, productive cough or wheezing. ?Denies chest pains, palpitations and leg swelling ?Denies abdominal pain, nausea, vomiting,diarrhea or constipation.   ?Denies dysuria, frequency, hesitancy or incontinence. ?Denies joint pain, swelling and limitation in mobility. ?Denies headaches, seizures, numbness, or tingling. ?Denies depression, anxiety or insomnia. ?Has rashes on left leg ? ? ?PE ? ?BP 129/84 (BP Location: Right Arm, Patient Position: Sitting, Cuff Size: Normal)   Pulse 82   Ht 5\' 2"  (1.575 m)   Wt 157 lb (71.2 kg)   SpO2 99%   BMI 28.72 kg/m?  ? ?Patient alert and oriented and in no cardiopulmonary distress. ? ? ?Chest: Clear to auscultation bilaterally. ? ?CVS: S1, S2 no murmurs, no S3.Regular rate. ? ?ABD: Soft non tender.  ? ?MS: Adequate ROM spine, shoulders, hips and knees. ? ?Skin:a patch of dry hyperpigmented pruritic skin noted on left leg, no ulcer no redness ? ?Psych: Good eye contact, normal affect. Memory intact not anxious or depressed appearing. ? ?CNS: CN 2-12 intact, power,  normal throughout.no focal deficits noted. ? ? ?Assessment & Plan ?Type 2 diabetes mellitus with hyperglycemia (HCC) ?She has been taking metformin 1000mg   daily and sometimes 2000mg  daily, she used jardiance 10mg  daily for a month and stopped because she had to pay $50 copay. ?Start taking metformin XR 2000 mg daily, restart jardiance 10mg  daily  ?A1C today has upcoming diabetic eye exam. ?Pt does not want injectable DM meds ?Need to adhere to medication regimen discussed with pt , she verbalized understanding  ?Need to increase intake of whole foods including vegetables and proteins, less carb discussed. ?Will increase Jardiance to 20 mg daily if A1c is still not at goal ?Lab Results  ?Component Value Date  ? HGBA1C 10.2 (A) 09/14/2021  ? ? ?Essential hypertension ?BP Readings from Last 3 Encounters:  ?12/13/21 129/84  ?09/14/21 134/82  ?06/08/21 133/82  ?well controlled ?Continue lisinopril 20mg  daily ?DASH diet and regular exercise advised ?CMP today ? ?Hyperlipidemia associated with type 2 diabetes mellitus (HCC) ?Lipid panel today ?Takes crestor 5mg  daily, need for medication adherence discussed with patient she verbalized understanding ?Lab Results  ?Component Value Date  ? CHOL 114 06/04/2021  ? HDL 41 06/04/2021  ? LDLCALC 54 06/04/2021  ? TRIG 100 06/04/2021  ? CHOLHDL 4.9 (H) 07/20/2020  ? ? ?Atopic dermatitis ?Rx Kenalog 0.1% cream apply cream to affected site on left leg  twice daily.   ?Patient referred to dermatology. ? ?Lumbago ?Med refilled  ?Takes ibuprofen 800 mg 3 times daily as needed ? ?Immunization due ?Patient educated on CDC recommendation for shingles vaccine. Verbal consent was obtained from the patient, shingles vaccine administered by  nurse, no sign of adverse reactions noted at this time. Patient education on arm soreness and use of tylenol or ibuprofen (if safe) for this patient  was discussed. Patient educated on the signs and symptoms of adverse effect and advise to contact the office if they occur.  ? ? ?

## 2021-12-13 NOTE — Assessment & Plan Note (Addendum)
Lipid panel today ?Takes crestor 5mg  daily, need for medication adherence discussed with patient she verbalized understanding ?Lab Results  ?Component Value Date  ? CHOL 114 06/04/2021  ? HDL 41 06/04/2021  ? LDLCALC 54 06/04/2021  ? TRIG 100 06/04/2021  ? CHOLHDL 4.9 (H) 07/20/2020  ? ?

## 2021-12-13 NOTE — Assessment & Plan Note (Addendum)
Rx Kenalog 0.1% cream apply cream to affected site on left leg  twice daily.   ?Patient referred to dermatology. ?

## 2021-12-14 ENCOUNTER — Telehealth: Payer: Self-pay | Admitting: Nurse Practitioner

## 2021-12-14 ENCOUNTER — Telehealth: Payer: Self-pay

## 2021-12-14 ENCOUNTER — Other Ambulatory Visit: Payer: Self-pay | Admitting: Nurse Practitioner

## 2021-12-14 DIAGNOSIS — E1165 Type 2 diabetes mellitus with hyperglycemia: Secondary | ICD-10-CM

## 2021-12-14 DIAGNOSIS — E1169 Type 2 diabetes mellitus with other specified complication: Secondary | ICD-10-CM

## 2021-12-14 MED ORDER — ROSUVASTATIN CALCIUM 10 MG PO TABS: 10.0000 mg | ORAL_TABLET | Freq: Every day | ORAL | 3 refills | Status: DC

## 2021-12-14 MED ORDER — EMPAGLIFLOZIN 25 MG PO TABS: 25.0000 mg | ORAL_TABLET | Freq: Every day | ORAL | 2 refills | Status: DC

## 2021-12-14 NOTE — Telephone Encounter (Signed)
Patient aware of lab results.

## 2021-12-14 NOTE — Telephone Encounter (Signed)
Please look into this

## 2021-12-14 NOTE — Telephone Encounter (Signed)
Please assist and send to [facility] ?

## 2021-12-14 NOTE — Progress Notes (Signed)
Please review results with patient ?A1c is still elevated, patient should start taking Jardiance 25 mg daily, prescription sent to her pharmacy. ?Cholesterol level is high, patient should start taking Crestor 10 mg daily, patient should adhere to all medications prescribed to help decrease her risk of heart attack and stroke. ?Please schedule patient for a 6-week follow-up to recheck her cholesterol levels ?Patient should have fasting labs done 3 to 5 days before her visit.  ?I have sent a consult to [facility] for assistance with cost of medication she should be expecting a call from them. ?Thank you. ?

## 2021-12-14 NOTE — Telephone Encounter (Signed)
Pt is calling to get lab results.

## 2021-12-14 NOTE — Telephone Encounter (Signed)
Patient called said that Jennifer Velasquez spoke with her over the phone she needed more labs drawn in six weeks.  Appt has been scheduled for April, need order for labs so patient will come in week before her next appt. ?

## 2021-12-15 LAB — CMP14+EGFR
ALT: 16 IU/L (ref 0–32)
AST: 20 IU/L (ref 0–40)
Albumin/Globulin Ratio: 1.5 (ref 1.2–2.2)
Albumin: 4.9 g/dL — ABNORMAL HIGH (ref 3.8–4.8)
Alkaline Phosphatase: 104 IU/L (ref 44–121)
BUN/Creatinine Ratio: 19 (ref 12–28)
BUN: 16 mg/dL (ref 8–27)
Bilirubin Total: 0.5 mg/dL (ref 0.0–1.2)
CO2: 22 mmol/L (ref 20–29)
Calcium: 10 mg/dL (ref 8.7–10.3)
Chloride: 96 mmol/L (ref 96–106)
Creatinine, Ser: 0.84 mg/dL (ref 0.57–1.00)
Globulin, Total: 3.2 g/dL (ref 1.5–4.5)
Glucose: 105 mg/dL — ABNORMAL HIGH (ref 70–99)
Potassium: 5 mmol/L (ref 3.5–5.2)
Sodium: 136 mmol/L (ref 134–144)
Total Protein: 8.1 g/dL (ref 6.0–8.5)
eGFR: 77 mL/min/{1.73_m2} (ref >59)

## 2021-12-15 LAB — LIPID PANEL
Chol/HDL Ratio: 4.9 ratio — ABNORMAL HIGH (ref 0.0–4.4)
Cholesterol, Total: 224 mg/dL — ABNORMAL HIGH (ref 100–199)
HDL: 46 mg/dL (ref >39)
LDL Chol Calc (NIH): 156 mg/dL — ABNORMAL HIGH (ref 0–99)
Triglycerides: 122 mg/dL (ref 0–149)
VLDL Cholesterol Cal: 22 mg/dL (ref 5–40)

## 2021-12-15 LAB — HEMOGLOBIN A1C
Est. average glucose Bld gHb Est-mCnc: 226 mg/dL
Hgb A1c MFr Bld: 9.5 % — ABNORMAL HIGH (ref 4.8–5.6)

## 2021-12-17 ENCOUNTER — Other Ambulatory Visit: Payer: Self-pay | Admitting: Nurse Practitioner

## 2021-12-17 DIAGNOSIS — E1169 Type 2 diabetes mellitus with other specified complication: Secondary | ICD-10-CM

## 2021-12-17 DIAGNOSIS — E785 Hyperlipidemia, unspecified: Secondary | ICD-10-CM

## 2021-12-17 NOTE — Telephone Encounter (Signed)
Lipid panel ordered, please notify pt. Thanks

## 2021-12-18 NOTE — Telephone Encounter (Signed)
Patient aware.

## 2021-12-19 ENCOUNTER — Telehealth: Payer: Self-pay | Admitting: *Deleted

## 2021-12-19 NOTE — Chronic Care Management (AMB) (Signed)
?  Care Management  ? ?Note ? ?12/19/2021 ?Name: Jennifer Velasquez MRN: [id number] DOB: [birth year] ? ?Jennifer Velasquez is a 66 y.o. year old female who is a primary care patient of Donell Beers, FNP. I reached out to Rondel Baton by phone today in response to a referral sent by Jennifer Velasquez primary care provider.  ? ?Jennifer Velasquez was given information about care management services today including:  ?Care management services include personalized support from designated clinical staff supervised by her physician, including individualized plan of care and coordination with other care providers ?24/7 contact phone numbers for assistance for urgent and routine care needs. ?The patient may stop care management services at any time by phone call to the office staff. ? ?Patient agreed to services and verbal consent obtained.  ? ?Follow up plan: ?Telephone appointment with care management team member scheduled for:12/26/21 ? ?Gwenevere Ghazi  ?Care Guide, Embedded Care Coordination ?[facility]  Care Management  ?Direct Dial: [phone] ? ?

## 2021-12-21 ENCOUNTER — Telehealth: Payer: Self-pay

## 2021-12-21 NOTE — Telephone Encounter (Signed)
Spoke with pt she wanted to know if her kidney function was normal and it was. ?

## 2021-12-21 NOTE — Telephone Encounter (Signed)
Patient is waiting on a call back regarding her lab results. ?

## 2021-12-26 ENCOUNTER — Ambulatory Visit: Payer: BC Managed Care – PPO | Admitting: Pharmacist

## 2021-12-26 DIAGNOSIS — E1169 Type 2 diabetes mellitus with other specified complication: Secondary | ICD-10-CM

## 2021-12-26 DIAGNOSIS — E1165 Type 2 diabetes mellitus with hyperglycemia: Secondary | ICD-10-CM

## 2021-12-26 NOTE — Chronic Care Management (AMB) (Signed)
?Care Management  ? ?Pharmacy Note ? ?12/26/2021 ?Name: Jennifer Velasquez MRN: [id number] DOB: 03/05/1956 ? ?Summary: ?Type 2 Diabetes ?Uncontrolled; Most recent A1c above goal of <7% per ADA guidelines ?Current medications: metformin 2,000 mg by mouth once daily and empagliflozin (Jardiance) 25 mg by mouth once daily ?Taking medications as directed: no, patient reports taking metformin 1,000 mg by mouth twice daily with meals and reports that she was never told about a Jardiance dose increase so she has been taking 10 mg by mouth daily ?Continue metformin 1,000 mg by mouth twice daily with meals ?Patient instructed to increase to empagliflozin (Jardiance) 25 mg by mouth once daily as prescribed by primary care provider ?Patient reports cost concern with Jardiance. Since she has Pharmacist, community, she was instructed to acquire Jardiance savings card from online or have her pharmacy help her acquire. ?If A1c and blood glucose remains elevated on metformin and Jardiance with good adherence, may be reasonable to consider a once weekly GLP-1 agonist as next line therapy.  ? ?Hyperlipidemia ?LDL above goal of <70 due to very high risk given 10-year risk >20% per 2020 AACE/ACE guidelines. ?Patient reports poor adherence due to concerns with statins that she has read about such as hair loss, etc. ?Continue rosuvastatin 10 mg by mouth once daily for now. LDL appeared to be well controlled previously when she had good adherence. Discussed need for medication compliance ? ?Subjective: ?Jennifer Velasquez is a 66 y.o. year old female who is a primary care patient of Renee Rival, FNP. The Care Management team was consulted for assistance with care management and care coordination needs.   ? ?Engaged with patient by telephone for initial visit in response to provider referral for pharmacy case management and/or care coordination services.  ? ?The patient was given information about Care Management services today including:  ?Care  Management services includes personalized support from designated clinical staff supervised by the patient's primary care provider, including individualized plan of care and coordination with other care providers. ?24/7 contact phone numbers for assistance for urgent and routine care needs. ?The patient may stop case management services at any time by phone call to the office staff. ? ?Patient agreed to services and consent obtained. ? ?Assessment:  Review of patient status, including review of consultants reports, laboratory and other test data, was performed as part of comprehensive evaluation and provision of chronic care management services.  ? ?SDOH (Social Determinants of Health) assessments and interventions performed:  ?SDOH Interventions   ? ?Flowsheet Row Most Recent Value  ?SDOH Interventions   ?Financial Strain Interventions Other (Comment)  [Instructions given to obtain manufacturer copay card]  ? ?  ?  ? ?Objective: ? ?Lab Results  ?Component Value Date  ? CREATININE 0.84 12/13/2021  ? CREATININE 0.77 06/04/2021  ? CREATININE 0.80 09/07/2020  ? ? ?Lab Results  ?Component Value Date  ? HGBA1C 9.5 (H) 12/13/2021  ? ? ?   ?Component Value Date/Time  ? CHOL 224 (H) 12/13/2021 1112  ? TRIG 122 12/13/2021 1112  ? HDL 46 12/13/2021 1112  ? CHOLHDL 4.9 (H) 12/13/2021 1112  ? CHOLHDL 3.1 01/26/2020 1040  ? VLDL 35 11/20/2011 1030  ? [place] 156 (H) 12/13/2021 1112  ? [place] 64 01/26/2020 1040  ? ?Clinical ASCVD: No  ?The 10-year ASCVD risk score (Arnett DK, et al., 2019) is: 24.8% ?  Values used to calculate the score: ?    Age: 7 years ?    Sex: Female ?  Is Non-Hispanic African American: Yes ?    Diabetic: Yes ?    Tobacco smoker: No ?    Systolic Blood Pressure: [id number] mmHg ?    Is BP treated: Yes ?    HDL Cholesterol: 46 mg/dL ?    Total Cholesterol: 224 mg/dL   ? ? ?BP Readings from Last 3 Encounters:  ?12/13/21 129/84  ?09/14/21 134/82  ?06/08/21 133/82  ? ? ?Care Plan ? ?Allergies  ?Allergen Reactions   ? Pravastatin Sodium   ?  REACTION: sore throat  ? ? ?Medications Reviewed Today   ? ? Reviewed by Beryle Lathe, [facility] (Pharmacist) on 12/26/21 at [phone]  Med List Status: <None>  ? ?Medication Order Taking? Sig Documenting Provider Last Dose Status Informant  ?diphenhydrAMINE (BENADRYL ALLERGY) 25 MG tablet [id number] Yes Take 25 mg by mouth every 6 (six) hours as needed for itching. [provider] Taking Active Self  ?empagliflozin (JARDIANCE) 25 MG TABS tablet [id number] Yes Take 1 tablet (25 mg total) by mouth daily before breakfast. [place], Dewaine Conger, FNP Taking Active   ?         ?Med Note Beryle Lathe   Wed Dec 26, 2021  8:33 AM) Still taking 10 mg daily  ?ibuprofen (ADVIL) 800 MG tablet [id number] Yes Take 1 tablet (800 mg total) by mouth every 8 (eight) hours as needed for moderate pain. Renee Rival, FNP Taking Active   ?lisinopril (ZESTRIL) 20 MG tablet [id number] Yes TAKE 1 TABLET BY MOUTH ONCE DAILY. Renee Rival, FNP Taking Active   ?metFORMIN (GLUCOPHAGE-XR) 500 MG 24 hr tablet [id number] Yes Take 4 tablets (2,000 mg total) by mouth daily with breakfast.  ?Patient taking differently: Take 1,000 mg by mouth 2 (two) times daily with a meal.  ? Paseda, Dewaine Conger, FNP Taking Active   ?Multiple Vitamin (MULTIVITAMIN WITH MINERALS) TABS tablet [id number] Yes Take 1 tablet by mouth daily. [provider] Taking Active Self  ?rosuvastatin (CRESTOR) 10 MG tablet [id number] Yes Take 1 tablet (10 mg total) by mouth daily. Renee Rival, FNP Taking Active   ?triamcinolone cream (KENALOG) 0.1 % [id number] Yes Apply 1 application topically 2 (two) times daily. Renee Rival, FNP Taking Active   ?TRUE METRIX BLOOD GLUCOSE TEST test strip [id number] Yes  [provider] Taking Active   ?TRUEplus Lancets [place] [id number] Yes  [provider] Taking Active   ?Vitamin D, Ergocalciferol, (DRISDOL) 1.25 MG (50000 UNIT) CAPS capsule [id number] Yes  Take 1 capsule (50,000 Units total) by mouth every 7 (seven) days. Noreene Larsson, NP Taking Active   ? ?  ?  ? ?  ? ? ?Patient Active Problem List  ? Diagnosis Date Noted  ? Atopic dermatitis 12/13/2021  ? Immunization due 03/29/2021  ? Hemorrhoid 03/29/2021  ? Annual visit for general adult medical examination with abnormal findings 07/20/2020  ? Post-menopausal 07/20/2020  ? Encounter for screening for malignant neoplasm of colon 07/20/2020  ? Carpal tunnel syndrome on right 07/20/2020  ? Pap smear of cervix declined 07/20/2020  ? Need for immunization against influenza 07/20/2020  ? Vitamin D deficiency 12/01/2011  ? Lumbago 07/20/2007  ? Type 2 diabetes mellitus with hyperglycemia ([place]) 01/07/2007  ? Hyperlipidemia associated with type 2 diabetes mellitus ([place]) 09/11/2006  ? Overweight (BMI 25.0-29.9) 09/11/2006  ? Essential hypertension 09/11/2006  ? ? ?Conditions to be addressed/monitored: HLD and DMII ? ?Care Plan : Medication Management  ?Updates made by Beryle Lathe, [place] [facility] since  12/26/2021 12:00 AM  ?  ? ?Problem: T2DM & HLD   ?Priority: High  ?Onset Date: 12/26/2021  ?  ? ?Long-Range Goal: Disease Progression Prevention   ?Start Date: 12/26/2021  ?Expected End Date: 03/26/2022  ?This Visit's Progress: On track  ?Priority: High  ?Note:   ?Current Barriers:  ?Unable to independently afford treatment regimen ?Unable to independently monitor therapeutic efficacy ?Unable to achieve control of diabetes and hyperlipidemia ? ?Pharmacist Clinical Goal(s):  ?Through collaboration with PharmD and provider, patient will  ?Verbalize ability to afford treatment regimen ?Achieve adherence to monitoring guidelines and medication adherence to achieve therapeutic efficacy ?Achieve control of diabetes and hyperlipidemia as evidenced by improved fasting blood sugar, improved A1c, and improved LDL  ? ?Interventions: ?1:1 collaboration with Renee Rival, FNP regarding development and update of comprehensive plan of  care as evidenced by provider attestation and co-signature ?Inter-disciplinary care team collaboration (see longitudinal plan of care) ?Comprehensive medication review performed; medication list updated

## 2021-12-26 NOTE — Patient Instructions (Signed)
Jennifer Velasquez, ? ?It was great to talk to you today! ? ?Please call me with any questions or concerns. ? ?Visit Information ? ?Care Plan : Medication Management  ?Updates made by Gavin Pound, RPH since 12/26/2021 12:00 AM  ?  ? ?Problem: T2DM & HLD   ?Priority: High  ?Onset Date: 12/26/2021  ?  ? ?Long-Range Goal: Disease Progression Prevention   ?Start Date: 12/26/2021  ?Expected End Date: 03/26/2022  ?This Visit's Progress: On track  ?Priority: High  ?Note:   ?Current Barriers:  ?Unable to independently afford treatment regimen ?Unable to independently monitor therapeutic efficacy ?Unable to achieve control of diabetes and hyperlipidemia ? ?Pharmacist Clinical Goal(s):  ?Through collaboration with PharmD and provider, patient will  ?Verbalize ability to afford treatment regimen ?Achieve adherence to monitoring guidelines and medication adherence to achieve therapeutic efficacy ?Achieve control of diabetes and hyperlipidemia as evidenced by improved fasting blood sugar, improved A1c, and improved LDL  ? ?Interventions: ?1:1 collaboration with Donell Beers, FNP regarding development and update of comprehensive plan of care as evidenced by provider attestation and co-signature ?Inter-disciplinary care team collaboration (see longitudinal plan of care) ?Comprehensive medication review performed; medication list updated in electronic medical record ? ?Type 2 Diabetes - New goal.: ?Uncontrolled; Most recent A1c above goal of <7% per ADA guidelines ?Current medications: metformin 2,000 mg by mouth once daily and empagliflozin (Jardiance) 25 mg by mouth once daily ?Intolerances: none ?Taking medications as directed: no, patient reports taking metformin 1,000 mg by mouth twice daily with meals and reports that she was never told about a Jardiance dose increase so she has been taking 10 mg by mouth daily ?Side effects thought to be attributed to current medication regimen: no ?Emergency hypoglycemia treatment:  not indicated at this time ?On a statin: yes, but poor adherence ?On aspirin 81 mg daily: no ?Last microalbumin/creatinine ratio: 7 (01/26/20); on an ACEi/ARB: yes ?Current glucose readings:  not discussed today ?Encouraged regular aerobic exercise with a goal of 30 minutes five times per week (150 minutes per week) ?Discussed need for medication compliance ?Continue metformin 1,000 mg by mouth twice daily with meals ?Patient instructed to increase to empagliflozin (Jardiance) 25 mg by mouth once daily as prescribed by primary care provider ?Patient reports cost concern with Jardiance. 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 ?If A1c and blood glucose remains elevated on metformin and Jardiance with good adherence, may be reasonable to consider a once weekly GLP-1 agonist as next line therapy.  ? ?Hyperlipidemia - New goal.: ?Uncontrolled. LDL above goal of <70 due to very high risk given 10-year risk >20% per 2020 AACE/ACE guidelines. Triglycerides at goal of <150 per 2020 AACE/ACE guidelines. ?Current medications: rosuvastatin 10 mg by mouth once daily ?Intolerances:  pravastatin (sore throat) ?Taking medications as directed: no, patient reports poor adherence due to concerns with statins that she has read about such as hair loss, etc. ?Side effects thought to be attributed to current medication regimen: no ?Encourage dietary reduction of high fat containing foods such as butter, nuts, bacon, egg yolks, etc. ?Recommend regular aerobic exercise ?Reviewed risks of hyperlipidemia, principles of treatment and consequences of untreated hyperlipidemia ?Continue rosuvastatin 10 mg by mouth once daily for now. LDL appeared to be well controlled previously when she had good adherence. Discussed need for medication compliance ? ?Patient Goals/Self-Care Activities ?Patient will:  ?Focus on medication adherence by keeping up with  prescription refills and either using a pill box or reminders to take your medications at the prescribed times ?Check blood sugar as instructed by primary care provider, document, and provide at future appointments ?Collaborate with provider on medication access solutions ?Target a minimum of 150 minutes of moderate intensity exercise weekly ?Engage in dietary modifications by fewer sweetened foods & beverages ? ?Follow Up Plan: Next PCP appointment scheduled for: 01/24/22 ?  ? ? ?Ms. Wentling was given information about care management services today including:  ?CCM service includes personalized support from designated clinical staff supervised by her physician, including individualized plan of care and coordination with other care providers ?24/7 contact phone numbers for assistance for urgent and routine care needs. ?The patient may stop CCM services at any time (effective at the end of the month) by phone call to the office staff. ? ?Patient agreed to services and verbal consent obtained.  ? ?Follow-up plan: Next PCP appointment scheduled for: 01/24/22 ? ?The patient verbalized understanding of instructions, educational materials, and care plan provided today and declined offer to receive copy of patient instructions, educational materials, and care plan.  ? ?Please call the care guide team at [phone] if you need to cancel or reschedule your appointment.  ? ?Domenic Moras, PharmD, BCACP, CPP ?Clinical Pharmacist Practitioner ?[facility] ?[phone]  ?

## 2022-01-03 DIAGNOSIS — B86 Scabies: Secondary | ICD-10-CM | POA: Diagnosis not present

## 2022-01-03 DIAGNOSIS — B369 Superficial mycosis, unspecified: Secondary | ICD-10-CM | POA: Diagnosis not present

## 2022-01-08 DIAGNOSIS — L308 Other specified dermatitis: Secondary | ICD-10-CM | POA: Diagnosis not present

## 2022-01-19 ENCOUNTER — Other Ambulatory Visit: Payer: Self-pay | Admitting: Nurse Practitioner

## 2022-01-19 DIAGNOSIS — I10 Essential (primary) hypertension: Secondary | ICD-10-CM

## 2022-01-21 ENCOUNTER — Other Ambulatory Visit: Payer: Self-pay | Admitting: Nurse Practitioner

## 2022-01-21 DIAGNOSIS — I10 Essential (primary) hypertension: Secondary | ICD-10-CM

## 2022-01-21 MED ORDER — LISINOPRIL 20 MG PO TABS: 20.0000 mg | ORAL_TABLET | Freq: Every day | ORAL | 0 refills | Status: DC

## 2022-01-24 ENCOUNTER — Ambulatory Visit: Payer: BC Managed Care – PPO | Admitting: Nurse Practitioner

## 2022-02-08 LAB — HM DIABETES EYE EXAM

## 2022-02-19 DIAGNOSIS — E1169 Type 2 diabetes mellitus with other specified complication: Secondary | ICD-10-CM | POA: Diagnosis not present

## 2022-02-19 DIAGNOSIS — E785 Hyperlipidemia, unspecified: Secondary | ICD-10-CM | POA: Diagnosis not present

## 2022-02-19 NOTE — Progress Notes (Signed)
No Retinopathy

## 2022-02-20 LAB — LIPID PANEL
Chol/HDL Ratio: 3 ratio (ref 0.0–4.4)
Cholesterol, Total: 128 mg/dL (ref 100–199)
HDL: 43 mg/dL (ref >39)
LDL Chol Calc (NIH): 69 mg/dL (ref 0–99)
Triglycerides: 79 mg/dL (ref 0–149)
VLDL Cholesterol Cal: 16 mg/dL (ref 5–40)

## 2022-02-20 NOTE — Progress Notes (Signed)
Normal labs, continue current medications

## 2022-02-22 ENCOUNTER — Ambulatory Visit: Payer: BC Managed Care – PPO | Admitting: Nurse Practitioner

## 2022-02-22 ENCOUNTER — Encounter: Payer: Self-pay | Admitting: Nurse Practitioner

## 2022-02-22 VITALS — BP 128/80 | HR 84 | Ht 62.0 in | Wt 153.1 lb

## 2022-02-22 DIAGNOSIS — Z139 Encounter for screening, unspecified: Secondary | ICD-10-CM

## 2022-02-22 DIAGNOSIS — E1165 Type 2 diabetes mellitus with hyperglycemia: Secondary | ICD-10-CM

## 2022-02-22 DIAGNOSIS — E559 Vitamin D deficiency, unspecified: Secondary | ICD-10-CM

## 2022-02-22 DIAGNOSIS — I10 Essential (primary) hypertension: Secondary | ICD-10-CM | POA: Diagnosis not present

## 2022-02-22 DIAGNOSIS — E785 Hyperlipidemia, unspecified: Secondary | ICD-10-CM

## 2022-02-22 DIAGNOSIS — E1169 Type 2 diabetes mellitus with other specified complication: Secondary | ICD-10-CM

## 2022-02-22 MED ORDER — EMPAGLIFLOZIN 25 MG PO TABS: 25.0000 mg | ORAL_TABLET | Freq: Every day | ORAL | 2 refills | Status: DC

## 2022-02-22 MED ORDER — METFORMIN HCL ER 500 MG PO TB24: 2000.0000 mg | ORAL_TABLET | Freq: Every day | ORAL | 0 refills | Status: DC

## 2022-02-22 NOTE — Assessment & Plan Note (Addendum)
Lab Results  ?Component Value Date  ? HGBA1C 9.5 (H) 12/13/2021  ?Chronic uncontrolled condition ?On Jardiance 25 mg daily, metformin 2000 mg daily ?Continue current medication ?Avoid sugar sweets soda sweeteners, and sugar-free products drink at least 64 ounces of water daily and engage in daily exercises at least for 50 minutes weekly ?We will recheck A1c in 2 months ?Need to get blood sugar under control discussed with patient ? ?

## 2022-02-22 NOTE — Assessment & Plan Note (Addendum)
Lab Results  ?Component Value Date  ? CHOL 128 02/19/2022  ? HDL 43 02/19/2022  ? LDLCALC 69 02/19/2022  ? TRIG 79 02/19/2022  ? CHOLHDL 3.0 02/19/2022  ?Currently well controlled on Crestor 10 mg daily ?Continue current medication avoid fatty fried foods. ?

## 2022-02-22 NOTE — Progress Notes (Signed)
? ?  MCKINSEY KEAGLE     MRN: [id number]      DOB: [birth year] ? ? ?HPI ?Ms. Rakestraw with past medical history of essential hypertension, type 2 diabetes with hyperglycemia, hyperlipidemia associated with type 2 diabetes, vitamin D deficiency is here for follow up for hyperlipidemia ? ?Hyperlipidemia.  Currently on Crestor 10 mg daily no reports of muscle pain, states that she is tolerating medication well. ? ?Type 2 diabetes.  On Jardiance 25 mg daily, metformin 2000 mg daily, patient reports tolerating medication well, she denies hypoglycemia, she sometimes checks her blood sugar after eating she does not have her CBG meter with her today. ? ? ?Patient is due for her second dose of shingles vaccine states that she will get the second dose at her next follow-up appointment ? ? ?ROS ?Denies recent fever or chills. ?Denies sinus pressure, nasal congestion, ear pain or sore throat. ?Denies chest congestion, productive cough or wheezing. ?Denies chest pains, palpitations and leg swelling ?Denies abdominal pain, nausea, vomiting,diarrhea or constipation.   ?Denies dysuria, frequency, hesitancy or incontinence. ?Denies depression, anxiety or insomnia. ? ? ? ?PE ? ?BP 137/84   Pulse 84   Ht 5\' 2"  (1.575 m)   Wt 153 lb 1.9 oz (69.5 kg)   SpO2 98%   BMI 28.01 kg/m?  ? ?Patient alert and oriented and in no cardiopulmonary distress. ? ? ?Chest: Clear to auscultation bilaterally. ? ?CVS: S1, S2 no murmurs, no S3.Regular rate. ? ?ABD: Soft non tender.  ? ?Ext: No edema ? ?MS: Adequate ROM spine, shoulders, hips and knees. ? ?Psych: Good eye contact, normal affect. Memory intact not anxious or depressed appearing. ? ? ? ? ?Assessment & Plan ? ?Essential hypertension ?BP Readings from Last 3 Encounters:  ?02/22/22 128/80  ?12/13/21 129/84  ?09/14/21 134/82  ?Chronic condition well-controlled on lisinopril 20 mg daily ?Continue current medication ?DASH diet advised engage in regular daily exercises at least 150 minutes weekly ? ?Type 2  diabetes mellitus with hyperglycemia (HCC) ?Lab Results  ?Component Value Date  ? HGBA1C 9.5 (H) 12/13/2021  ?Chronic uncontrolled condition ?On Jardiance 25 mg daily, metformin 2000 mg daily ?Continue current medication ?Avoid sugar sweets soda sweeteners, and sugar-free products drink at least 64 ounces of water daily and engage in daily exercises at least for 50 minutes weekly ?We will recheck A1c in 2 months ?Need to get blood sugar under control discussed with patient ? ? ?Hyperlipidemia associated with type 2 diabetes mellitus (HCC) ?Lab Results  ?Component Value Date  ? CHOL 128 02/19/2022  ? HDL 43 02/19/2022  ? LDLCALC 69 02/19/2022  ? TRIG 79 02/19/2022  ? CHOLHDL 3.0 02/19/2022  ?Currently well controlled on Crestor 10 mg daily ?Continue current medication avoid fatty fried foods.  ?

## 2022-02-22 NOTE — Assessment & Plan Note (Signed)
Last vitamin D ?Lab Results  ?Component Value Date  ? VD25OH 23.0 (L) 07/20/2020  ?On vitamin D 50,000 units once weekly ?Check vitamin D levels at next visit ?

## 2022-02-22 NOTE — Assessment & Plan Note (Signed)
BP Readings from Last 3 Encounters:  ?02/22/22 128/80  ?12/13/21 129/84  ?09/14/21 134/82  ?Chronic condition well-controlled on lisinopril 20 mg daily ?Continue current medication ?DASH diet advised engage in regular daily exercises at least 150 minutes weekly ?

## 2022-02-22 NOTE — Patient Instructions (Addendum)
Pleas get your labs done 3-5 days before your next visit ? ?It is important that you exercise regularly at least 30 minutes 5 times a week.  ?Think about what you will eat, plan ahead. ?Choose " clean, green, fresh or frozen" over canned, processed or packaged foods which are more sugary, salty and fatty. ?70 to 75% of food eaten should be vegetables and fruit. ?Three meals at set times with snacks allowed between meals, but they must be fruit or vegetables. ?Aim to eat over a 12 hour period , example 7 am to 7 pm, and STOP after  your last meal of the day. ?Drink water,generally about 64 ounces per day, no other drink is as healthy. Fruit juice is best enjoyed in a healthy way, by EATING the fruit. ? ?Thanks for choosing [facility], we consider it a privelige to serve you.  ?

## 2022-03-19 ENCOUNTER — Other Ambulatory Visit: Payer: Self-pay | Admitting: Nurse Practitioner

## 2022-03-19 DIAGNOSIS — M545 Low back pain, unspecified: Secondary | ICD-10-CM

## 2022-04-18 ENCOUNTER — Other Ambulatory Visit: Payer: Self-pay | Admitting: Nurse Practitioner

## 2022-04-18 ENCOUNTER — Encounter: Payer: BC Managed Care – PPO | Admitting: Nurse Practitioner

## 2022-04-18 DIAGNOSIS — I10 Essential (primary) hypertension: Secondary | ICD-10-CM

## 2022-04-27 DIAGNOSIS — R519 Headache, unspecified: Secondary | ICD-10-CM | POA: Diagnosis not present

## 2022-04-27 DIAGNOSIS — J329 Chronic sinusitis, unspecified: Secondary | ICD-10-CM | POA: Diagnosis not present

## 2022-04-27 DIAGNOSIS — E119 Type 2 diabetes mellitus without complications: Secondary | ICD-10-CM | POA: Diagnosis not present

## 2022-04-27 DIAGNOSIS — E785 Hyperlipidemia, unspecified: Secondary | ICD-10-CM | POA: Diagnosis not present

## 2022-04-27 DIAGNOSIS — I10 Essential (primary) hypertension: Secondary | ICD-10-CM | POA: Diagnosis not present

## 2022-04-29 ENCOUNTER — Telehealth: Payer: Self-pay | Admitting: Internal Medicine

## 2022-05-02 DIAGNOSIS — E119 Type 2 diabetes mellitus without complications: Secondary | ICD-10-CM | POA: Diagnosis not present

## 2022-05-02 DIAGNOSIS — L239 Allergic contact dermatitis, unspecified cause: Secondary | ICD-10-CM | POA: Diagnosis not present

## 2022-05-02 DIAGNOSIS — I10 Essential (primary) hypertension: Secondary | ICD-10-CM | POA: Diagnosis not present

## 2022-07-24 DIAGNOSIS — I10 Essential (primary) hypertension: Secondary | ICD-10-CM | POA: Diagnosis not present

## 2022-07-24 DIAGNOSIS — E1165 Type 2 diabetes mellitus with hyperglycemia: Secondary | ICD-10-CM | POA: Diagnosis not present

## 2022-07-24 DIAGNOSIS — Z139 Encounter for screening, unspecified: Secondary | ICD-10-CM | POA: Diagnosis not present

## 2022-07-26 LAB — CBC WITH DIFFERENTIAL/PLATELET
Basophils Absolute: 0 10*3/uL (ref 0.0–0.2)
Basos: 1 %
EOS (ABSOLUTE): 0.2 10*3/uL (ref 0.0–0.4)
Eos: 4 %
Hematocrit: 38.9 % (ref 34.0–46.6)
Hemoglobin: 13.2 g/dL (ref 11.1–15.9)
Immature Grans (Abs): 0 10*3/uL (ref 0.0–0.1)
Immature Granulocytes: 0 %
Lymphocytes Absolute: 3 10*3/uL (ref 0.7–3.1)
Lymphs: 49 %
MCH: 28.5 pg (ref 26.6–33.0)
MCHC: 33.9 g/dL (ref 31.5–35.7)
MCV: 84 fL (ref 79–97)
Monocytes Absolute: 0.4 10*3/uL (ref 0.1–0.9)
Monocytes: 6 %
Neutrophils Absolute: 2.4 10*3/uL (ref 1.4–7.0)
Neutrophils: 40 %
Platelets: 322 10*3/uL (ref 150–450)
RBC: 4.63 x10E6/uL (ref 3.77–5.28)
RDW: 12 % (ref 11.7–15.4)
WBC: 6 10*3/uL (ref 3.4–10.8)

## 2022-07-26 LAB — BASIC METABOLIC PANEL
BUN/Creatinine Ratio: 13 (ref 12–28)
BUN: 10 mg/dL (ref 8–27)
CO2: 25 mmol/L (ref 20–29)
Calcium: 9.6 mg/dL (ref 8.7–10.3)
Chloride: 98 mmol/L (ref 96–106)
Creatinine, Ser: 0.75 mg/dL (ref 0.57–1.00)
Glucose: 176 mg/dL — ABNORMAL HIGH (ref 70–99)
Potassium: 4.4 mmol/L (ref 3.5–5.2)
Sodium: 137 mmol/L (ref 134–144)
eGFR: 87 mL/min/{1.73_m2} (ref >59)

## 2022-07-26 LAB — HEMOGLOBIN A1C
Est. average glucose Bld gHb Est-mCnc: 258 mg/dL
Hgb A1c MFr Bld: 10.6 % — ABNORMAL HIGH (ref 4.8–5.6)

## 2022-07-26 LAB — VITAMIN D 25 HYDROXY (VIT D DEFICIENCY, FRACTURES): Vit D, 25-Hydroxy: 33.5 ng/mL (ref 30.0–100.0)

## 2022-07-26 LAB — MICROALBUMIN / CREATININE URINE RATIO
Creatinine, Urine: 50.3 mg/dL
Microalb/Creat Ratio: <6 mg/g creat (ref 0–29)
Microalbumin, Urine: <3 ug/mL

## 2022-07-26 LAB — TSH: TSH: 1.56 u[IU]/mL (ref 0.450–4.500)

## 2022-07-26 LAB — HEPATITIS C ANTIBODY: Hep C Virus Ab: NONREACTIVE

## 2022-07-29 ENCOUNTER — Encounter: Payer: Self-pay | Admitting: Internal Medicine

## 2022-07-29 ENCOUNTER — Ambulatory Visit: Payer: No Typology Code available for payment source | Admitting: Internal Medicine

## 2022-07-29 VITALS — BP 170/92 | HR 84 | Ht 62.5 in | Wt 157.8 lb

## 2022-07-29 DIAGNOSIS — L2089 Other atopic dermatitis: Secondary | ICD-10-CM | POA: Diagnosis not present

## 2022-07-29 DIAGNOSIS — I10 Essential (primary) hypertension: Secondary | ICD-10-CM | POA: Diagnosis not present

## 2022-07-29 DIAGNOSIS — Z23 Encounter for immunization: Secondary | ICD-10-CM

## 2022-07-29 DIAGNOSIS — E1169 Type 2 diabetes mellitus with other specified complication: Secondary | ICD-10-CM | POA: Diagnosis not present

## 2022-07-29 DIAGNOSIS — E1165 Type 2 diabetes mellitus with hyperglycemia: Secondary | ICD-10-CM

## 2022-07-29 DIAGNOSIS — E785 Hyperlipidemia, unspecified: Secondary | ICD-10-CM

## 2022-07-29 MED ORDER — ROSUVASTATIN CALCIUM 10 MG PO TABS: 10.0000 mg | ORAL_TABLET | Freq: Every day | ORAL | 3 refills | Status: DC

## 2022-07-29 MED ORDER — BLOOD PRESSURE CUFF MISC: 0 refills | Status: DC

## 2022-07-29 MED ORDER — EMPAGLIFLOZIN 25 MG PO TABS: 25.0000 mg | ORAL_TABLET | Freq: Every day | ORAL | 2 refills | Status: DC

## 2022-07-29 MED ORDER — LISINOPRIL 20 MG PO TABS: 20.0000 mg | ORAL_TABLET | Freq: Every day | ORAL | 3 refills | Status: DC

## 2022-07-29 MED ORDER — HYDROCORTISONE 1 % EX LOTN: 1.0000 | TOPICAL_LOTION | Freq: Two times a day (BID) | CUTANEOUS | 0 refills | Status: DC

## 2022-07-29 MED ORDER — METFORMIN HCL ER 500 MG PO TB24: 2000.0000 mg | ORAL_TABLET | Freq: Every day | ORAL | 0 refills | Status: DC

## 2022-07-29 MED ORDER — VITAMIN D 25 MCG (1000 UNIT) PO TABS: 1000.0000 [IU] | ORAL_TABLET | Freq: Every day | ORAL | 1 refills | Status: DC

## 2022-07-29 NOTE — Progress Notes (Signed)
Established Patient Office Visit  Subjective   Patient ID: Jennifer Velasquez, female    DOB: 21-Jul-1956  Age: 66 y.o. MRN: [id number]  Chief Complaint  Patient presents with   Follow-up   [name] returns to care today.  She is a 66 year old woman with a past medical history significant for HTN, T2DM, HLD.  She was last seen at [facility] by Vena Rua, NP on 02/22/2022.  No medication changes were made at that appointment.  There have been no significant interval events.  Today Jennifer Velasquez's acute concern is chronic itching.  She describes lesions on her elbows and shins that caused her to itch incessantly.  She has previously been evaluated by dermatology for this but cannot recall what diagnosis she was given.  Of note, and previous records there have been referrals to dermatology for evaluation of atopic dermatitis.  She is currently applying a moisturizer cream but states that she still has small lesions on her hand, elbow, and shin.  She is otherwise asymptomatic and without concerns today.  Acute concerns, chronic medical conditions, and outstanding preventative healthcare maintenance items discussed today are individually addressed in A/P below.  Past Medical History:  Diagnosis Date   Back pain    CARPAL TUNNEL SYNDROME, LEFT 06/02/2008   Qualifier: Diagnosis of  By: Jonna Munro MD, Cornelius     COVID-19 virus infection 09/29/2019   DEPRESSION, SITUATIONAL 11/25/2008   Qualifier: Diagnosis of  Problem Stop Reason:  By: Jonna Munro MD, Julieta Bellini about COVID-19 virus infection 10/21/2019   LYMPHADENOPATHY 05/28/2010   Qualifier: Diagnosis of  By: Claybon Jabs PA, Dawn     Neck pain    left    Need for prophylactic hormone replacement therapy (postmenopausal)    Obesity, unspecified    Other and unspecified hyperlipidemia    Routine general medical examination at a health care facility    Trichomonal vaginitis    Type II or unspecified type diabetes mellitus without mention of complication,  not stated as uncontrolled    Unspecified constipation    Unspecified essential hypertension    Past Surgical History:  Procedure Laterality Date   APPENDECTOMY     DENTAL SURGERY     TOTAL ABDOMINAL HYSTERECTOMY     fibroids   Social History   Tobacco Use   Smoking status: Former    Types: Cigarettes   Smokeless tobacco: Never  Vaping Use   Vaping Use: Never used  Substance Use Topics   Alcohol use: Yes   Drug use: No   Family History  Problem Relation Age of Onset   Pneumonia Mother    Transient ischemic attack Father    Hypertension Father    Cancer Father    Hypertension Sister    Hypertension Sister    Cancer Daughter        breast    Allergies  Allergen Reactions   Pravastatin Sodium     REACTION: sore throat      Review of Systems  Skin:  Positive for itching.  All other systems reviewed and are negative.    Objective:     BP (!) 170/92   Pulse 84   Ht 5' 2.5" (1.588 m)   Wt 157 lb 12.8 oz (71.6 kg)   SpO2 98%   BMI 28.40 kg/m  BP Readings from Last 3 Encounters:  07/29/22 (!) 170/92  02/22/22 128/80  12/13/21 129/84      Physical Exam Constitutional:  General: She is not in acute distress.    Appearance: Normal appearance. She is not toxic-appearing.  HENT:     Head: Normocephalic and atraumatic.     Right Ear: External ear normal.     Left Ear: External ear normal.     Nose: Nose normal. No congestion or rhinorrhea.     Mouth/Throat:     Mouth: Mucous membranes are moist.     Pharynx: Oropharynx is clear. No oropharyngeal exudate or posterior oropharyngeal erythema.  Eyes:     General: No scleral icterus.    Extraocular Movements: Extraocular movements intact.     Conjunctiva/sclera: Conjunctivae normal.     Pupils: Pupils are equal, round, and reactive to light.  Cardiovascular:     Rate and Rhythm: Normal rate and regular rhythm.     Pulses: Normal pulses.     Heart sounds: Normal heart sounds. No murmur heard.    No  friction rub. No gallop.  Pulmonary:     Effort: Pulmonary effort is normal.     Breath sounds: Normal breath sounds. No wheezing, rhonchi or rales.  Abdominal:     General: Abdomen is flat. Bowel sounds are normal. There is no distension.     Palpations: Abdomen is soft.     Tenderness: There is no abdominal tenderness.  Musculoskeletal:        General: No swelling. Normal range of motion.     Cervical back: Normal range of motion.     Right lower leg: No edema.     Left lower leg: No edema.  Lymphadenopathy:     Cervical: No cervical adenopathy.  Skin:    General: Skin is warm and dry.     Capillary Refill: Capillary refill takes less than 2 seconds.     Comments: Small, dried excoriations noted on hand, right elbow/upper arm, and left shin.  No purulence or erythema appreciated.  Neurological:     General: No focal deficit present.     Mental Status: She is alert and oriented to person, place, and time.  Psychiatric:        Mood and Affect: Mood normal.        Behavior: Behavior normal.    Last CBC Lab Results  Component Value Date   WBC 6.0 07/24/2022   HGB 13.2 07/24/2022   HCT 38.9 07/24/2022   MCV 84 07/24/2022   MCH 28.5 07/24/2022   RDW 12.0 07/24/2022   PLT 322 09/62/8366   Last metabolic panel Lab Results  Component Value Date   GLUCOSE 176 (H) 07/24/2022   NA 137 07/24/2022   K 4.4 07/24/2022   CL 98 07/24/2022   CO2 25 07/24/2022   BUN 10 07/24/2022   CREATININE 0.75 07/24/2022   EGFR 87 07/24/2022   CALCIUM 9.6 07/24/2022   PROT 8.1 12/13/2021   ALBUMIN 4.9 (H) 12/13/2021   LABGLOB 3.2 12/13/2021   AGRATIO 1.5 12/13/2021   BILITOT 0.5 12/13/2021   ALKPHOS 104 12/13/2021   AST 20 12/13/2021   ALT 16 12/13/2021   ANIONGAP 7 09/07/2020   Last lipids Lab Results  Component Value Date   CHOL 128 02/19/2022   HDL 43 02/19/2022   LDLCALC 69 02/19/2022   TRIG 79 02/19/2022   CHOLHDL 3.0 02/19/2022   Last hemoglobin A1c Lab Results   Component Value Date   HGBA1C 10.6 (H) 07/24/2022   Last thyroid functions Lab Results  Component Value Date   TSH 1.560 07/24/2022   Last vitamin  D Lab Results  Component Value Date   VD25OH 33.5 07/24/2022     Assessment & Plan:   Problem List Items Addressed This Visit       Essential hypertension    BP 170/92 initially, 164/86 on repeat.  She is currently prescribed lisinopril 20 mg daily and has been previously well controlled on this regimen.  She attributes today's elevations to itching. -No medication changes today.  Jennifer Velasquez is very hesitant to make any changes to her medications because she feels as though her elevated blood pressure today is largely related to itching.  I recommended starting amlodipine 5 mg daily, but she declined.  We will plan for follow-up in 1 month for BP check.  In the interim, I have asked that she check her blood pressure 2-3 times weekly and bring a log to her next appointment.      Type 2 diabetes mellitus with hyperglycemia ([place]) - Primary    Poorly controlled.  HgbA1c 10.6 earlier this month.  She is currently prescribed metformin 2000 mg daily.  She has also been prescribed Jardiance 25 mg daily but states that she only took the medication for a month because she lost the coupon card.  Denies polyuria/polydipsia.  Urine microalbumin/creatinine ratio within normal limits earlier this month. -Through shared decision making, Jennifer Velasquez would like to restart Jardiance.  I recommended either starting basal insulin or GLP-1 therapy.  She believes that her blood sugar was under much better control while taking Jardiance and she is hesitant to start an injectable medication. -Jardiance 25 mg restarted today.  We will follow-up in 1 month for reassessment.  If her blood pressure remains significantly elevated, we will plan to start GLP-1 therapy.      Atopic dermatitis    Chronic issue.  Previously referred to dermatology.  She endorses persistent  itching today.  I have prescribed hydrocortisone 1% lotion.  She will return to care in 1 month.      Need for immunization against influenza    Influenza vaccine administered today.  She will make a nurse appointment for PCV 20 vaccine in the next few weeks.       Return in about 4 weeks (around 08/26/2022) for BP check / DM.    Johnette Abraham, MD

## 2022-07-29 NOTE — Assessment & Plan Note (Signed)
BP 170/92 initially, 164/86 on repeat.  She is currently prescribed lisinopril 20 mg daily and has been previously well controlled on this regimen.  She attributes today's elevations to itching. -No medication changes today.  Jennifer Velasquez is very hesitant to make any changes to her medications because she feels as though her elevated blood pressure today is largely related to itching.  I recommended starting amlodipine 5 mg daily, but she declined.  We will plan for follow-up in 1 month for BP check.  In the interim, I have asked that she check her blood pressure 2-3 times weekly and bring a log to her next appointment.

## 2022-07-29 NOTE — Assessment & Plan Note (Signed)
Poorly controlled.  HgbA1c 10.6 earlier this month.  She is currently prescribed metformin 2000 mg daily.  She has also been prescribed Jardiance 25 mg daily but states that she only took the medication for a month because she lost the coupon card.  Denies polyuria/polydipsia.  Urine microalbumin/creatinine ratio within normal limits earlier this month. -Through shared decision making, Jennifer Velasquez would like to restart Jardiance.  I recommended either starting basal insulin or GLP-1 therapy.  She believes that her blood sugar was under much better control while taking Jardiance and she is hesitant to start an injectable medication. -Jardiance 25 mg restarted today.  We will follow-up in 1 month for reassessment.  If her blood pressure remains significantly elevated, we will plan to start GLP-1 therapy.

## 2022-07-29 NOTE — Assessment & Plan Note (Signed)
Influenza vaccine administered today.  She will make a nurse appointment for PCV 20 vaccine in the next few weeks.

## 2022-07-29 NOTE — Patient Instructions (Signed)
It was a pleasure to see you today.  Thank you for giving [place] the opportunity to be involved in your care.  Below is a brief recap of your visit and next steps.  We will plan to see you again in 1 month  Summary I have refilled your medications today You will receive your flu shot  Your blood pressure is high today. Please take lisinopril as prescribed and check your blood pressure at home over the next 4 weeks. Your A1c is high as well. I have restarted Jardiance. Please check your blood sugar at home.  Next steps Follow up in 1 month

## 2022-07-29 NOTE — Assessment & Plan Note (Signed)
Chronic issue.  Previously referred to dermatology.  She endorses persistent itching today.  I have prescribed hydrocortisone 1% lotion.  She will return to care in 1 month.

## 2022-07-30 ENCOUNTER — Other Ambulatory Visit (HOSPITAL_COMMUNITY): Payer: Self-pay | Admitting: Internal Medicine

## 2022-07-30 DIAGNOSIS — Z1231 Encounter for screening mammogram for malignant neoplasm of breast: Secondary | ICD-10-CM

## 2022-08-09 ENCOUNTER — Ambulatory Visit (INDEPENDENT_AMBULATORY_CARE_PROVIDER_SITE_OTHER): Payer: No Typology Code available for payment source

## 2022-08-09 DIAGNOSIS — Z23 Encounter for immunization: Secondary | ICD-10-CM | POA: Diagnosis not present

## 2022-08-16 ENCOUNTER — Encounter: Payer: Self-pay | Admitting: Internal Medicine

## 2022-08-16 ENCOUNTER — Other Ambulatory Visit: Payer: Self-pay

## 2022-08-16 ENCOUNTER — Ambulatory Visit: Payer: No Typology Code available for payment source | Admitting: Internal Medicine

## 2022-08-16 ENCOUNTER — Telehealth: Payer: Self-pay | Admitting: Internal Medicine

## 2022-08-16 VITALS — BP 125/76 | HR 97 | Ht 62.5 in | Wt 155.8 lb

## 2022-08-16 DIAGNOSIS — M79604 Pain in right leg: Secondary | ICD-10-CM | POA: Diagnosis not present

## 2022-08-16 DIAGNOSIS — M79605 Pain in left leg: Secondary | ICD-10-CM

## 2022-08-16 DIAGNOSIS — E1165 Type 2 diabetes mellitus with hyperglycemia: Secondary | ICD-10-CM

## 2022-08-16 DIAGNOSIS — R21 Rash and other nonspecific skin eruption: Secondary | ICD-10-CM

## 2022-08-16 MED ORDER — MELOXICAM 7.5 MG PO TABS: 7.5000 mg | ORAL_TABLET | Freq: Every day | ORAL | 0 refills | Status: AC

## 2022-08-16 MED ORDER — TRIAMCINOLONE ACETONIDE 0.1 % EX CREA: 1.0000 | TOPICAL_CREAM | Freq: Two times a day (BID) | CUTANEOUS | 1 refills | Status: DC

## 2022-08-16 NOTE — Patient Instructions (Signed)
It was a pleasure to see you today.  Thank you for giving [place] the opportunity to be involved in your care.  Below is a brief recap of your visit and next steps.  We will plan to see you again in 2 weeks.  Summary I have prescribed triamcinolone cream and meloxicam for your leg pain and rash I recommend wearing compression stocking as well We will follow up in 2 weeks to discuss diabetes and see how your rash is doing

## 2022-08-16 NOTE — Assessment & Plan Note (Signed)
Returning to care today for evaluation of persistent painful, itching lower extremity rash bilaterally.  She has been treated with topical steroids previously without sustained relief.  She has also been evaluated by dermatology previously but cannot report which she was told.  Her rash has previously been classified as atopic dermatitis.  I am not sure her current symptoms are consistent with atopic dermatitis.  Her rash today and endorsement of pain is concerning for additional etiologies. -I have prescribed triamcinolone 0.1% cream for twice daily application to the rash on her lower extremities.  I have additionally prescribed meloxicam 7.5 mg daily x14 days for pain relief.  I recommended that she wear compression stockings as well.  We will follow-up in 2 weeks for reassessment.  Given the persistence of her rash and discomfort, I have placed a new referral to dermatology today.

## 2022-08-16 NOTE — Assessment & Plan Note (Signed)
Poorly controlled.  A1c 10.6 last month.  He is currently prescribed metformin 2000 mg daily and Jardiance was restarted at her last appointment. -We will follow-up in 2 weeks to further discuss diabetes management

## 2022-08-16 NOTE — Progress Notes (Signed)
Acute Office Visit  Subjective:     Patient ID: Jennifer Velasquez, female    DOB: Jul 31, 1956, 66 y.o.   MRN: [id number]  Chief Complaint  Patient presents with   Leg Problem    Itching on legs   Jennifer Velasquez presents today for an acute visit to discuss persistent bilateral lower extremity pain and itching.  She was last seen by me on 10/16 and endorsed symptoms of itching in her lower extremities.  I prescribed hydrocortisone 1% lotion for twice daily use.  Despite these measures, her lower extremity discomfort has persisted.  She states that topical steroids temporarily relieve her symptoms, but the relief does not last.  She is currently using cool compresses at night to help her sleep.  The rash on her lower extremities has not worsened.  Her symptoms are largely unchanged.  She denies joint pain and fever/chills.  She is interested in additional relief options.  Review of Systems  Skin:  Positive for rash (Rash on bilateral lower extremities).  All other systems reviewed and are negative.     Objective:    BP 125/76   Pulse 97   Ht 5' 2.5" (1.588 m)   Wt 155 lb 12.8 oz (70.7 kg)   SpO2 97%   BMI 28.04 kg/m   Physical Exam Vitals reviewed.  Constitutional:      General: She is not in acute distress.    Appearance: Normal appearance.  Skin:    Findings: Rash (There are large, discolored, rough patches on the lateral aspects of the lower extremities bilaterally.  There is no purulence appreciated.  The patches are nontender to palpation.  There is no evidence of effusion in any ankles or knees.) present.       Assessment & Plan:   Problem List Items Addressed This Visit       Type 2 diabetes mellitus with hyperglycemia ([place])    Poorly controlled.  A1c 10.6 last month.  He is currently prescribed metformin 2000 mg daily and Jardiance was restarted at her last appointment. -We will follow-up in 2 weeks to further discuss diabetes management      Rash and nonspecific skin  eruption    Returning to care today for evaluation of persistent painful, itching lower extremity rash bilaterally.  She has been treated with topical steroids previously without sustained relief.  She has also been evaluated by dermatology previously but cannot report which she was told.  Her rash has previously been classified as atopic dermatitis.  I am not sure her current symptoms are consistent with atopic dermatitis.  Her rash today and endorsement of pain is concerning for additional etiologies. -I have prescribed triamcinolone 0.1% cream for twice daily application to the rash on her lower extremities.  I have additionally prescribed meloxicam 7.5 mg daily x14 days for pain relief.  I recommended that she wear compression stockings as well.  We will follow-up in 2 weeks for reassessment.  Given the persistence of her rash and discomfort, I have placed a new referral to dermatology today.      Meds ordered this encounter  Medications   triamcinolone cream (KENALOG) 0.1 %    Sig: Apply 1 Application topically 2 (two) times daily.    Dispense:  30 g    Refill:  1   meloxicam (MOBIC) 7.5 MG tablet    Sig: Take 1 tablet (7.5 mg total) by mouth daily for 14 doses.    Dispense:  14 tablet  Refill:  0    Return in about 2 weeks (around 08/30/2022) for DM, rash.  Jennifer Lade, MD

## 2022-08-16 NOTE — Telephone Encounter (Signed)
Pt called stating she is needing an order stating the strength she needs for the compression socks?   [employer]

## 2022-08-29 ENCOUNTER — Encounter: Payer: Self-pay | Admitting: Internal Medicine

## 2022-08-29 ENCOUNTER — Ambulatory Visit: Payer: No Typology Code available for payment source | Admitting: Internal Medicine

## 2022-08-29 VITALS — BP 112/72 | HR 81 | Ht 62.0 in | Wt 156.0 lb

## 2022-08-29 DIAGNOSIS — E1165 Type 2 diabetes mellitus with hyperglycemia: Secondary | ICD-10-CM | POA: Diagnosis not present

## 2022-08-29 DIAGNOSIS — Z23 Encounter for immunization: Secondary | ICD-10-CM

## 2022-08-29 DIAGNOSIS — R21 Rash and other nonspecific skin eruption: Secondary | ICD-10-CM | POA: Diagnosis not present

## 2022-08-29 NOTE — Patient Instructions (Signed)
It was a pleasure to see you today.  Thank you for giving [place] the opportunity to be involved in your care.  Below is a brief recap of your visit and next steps.  We will plan to see you again in 2 months.  Summary No medication changes today. We will follow up in 2 months. I am glad to hear that your legs are doing better. Enjoy the holidays!

## 2022-08-29 NOTE — Progress Notes (Signed)
Established Patient Office Visit  Subjective   Patient ID: KARMON ANDIS, female    DOB: 03-15-1956  Age: 66 y.o. MRN: [id number]  Chief Complaint  Patient presents with   Follow-up   Ms. Whitsitt returns to care today.  She was last seen by me on 11/3 at which time she continued to endorse a bilateral, painful, itching lower extremity rash.  I prescribed triamcinolone cream for twice daily application and also prescribed meloxicam x14 days for pain relief.  I recommended that she wear compression stockings and placed in a referral to dermatology.  There have been no acute interval events.  Today Ms. Stooksbury states that her lower extremity rash and pain has significantly improved.  She currently denies pain and itching.  She has not scheduled an appointment with dermatology.  Ms. Limburg reports that since her last appointment she has restarted taking Jardiance.  Her blood sugar was 105 this morning and she reports recent AM readings around 140.  She has no additional concerns to discuss today.  Past Medical History:  Diagnosis Date   Back pain    CARPAL TUNNEL SYNDROME, LEFT 06/02/2008   Qualifier: Diagnosis of  By: Jonna Munro MD, Cornelius     COVID-19 virus infection 09/29/2019   DEPRESSION, SITUATIONAL 11/25/2008   Qualifier: Diagnosis of  Problem Stop Reason:  By: Jonna Munro MD, Julieta Bellini about COVID-19 virus infection 10/21/2019   LYMPHADENOPATHY 05/28/2010   Qualifier: Diagnosis of  By: Claybon Jabs PA, Dawn     Neck pain    left    Need for prophylactic hormone replacement therapy (postmenopausal)    Obesity, unspecified    Other and unspecified hyperlipidemia    Routine general medical examination at a health care facility    Trichomonal vaginitis    Type II or unspecified type diabetes mellitus without mention of complication, not stated as uncontrolled    Unspecified constipation    Unspecified essential hypertension    Past Surgical History:  Procedure Laterality Date    APPENDECTOMY     DENTAL SURGERY     TOTAL ABDOMINAL HYSTERECTOMY     fibroids   Social History   Tobacco Use   Smoking status: Former    Types: Cigarettes   Smokeless tobacco: Never  Vaping Use   Vaping Use: Never used  Substance Use Topics   Alcohol use: Yes   Drug use: No   Family History  Problem Relation Age of Onset   Pneumonia Mother    Transient ischemic attack Father    Hypertension Father    Cancer Father    Hypertension Sister    Hypertension Sister    Cancer Daughter        breast    Allergies  Allergen Reactions   Pravastatin Sodium     REACTION: sore throat   Review of Systems  Skin:  Positive for itching and rash (Resolving bilateral lower extremity rash).  All other systems reviewed and are negative.    Objective:     BP 112/72   Pulse 81   Ht _0  (1.575 m)   Wt 156 lb (70.8 kg)   SpO2 98%   BMI 28.53 kg/m  BP Readings from Last 3 Encounters:  08/29/22 112/72  08/16/22 125/76  07/29/22 (!) 170/92   Physical Exam Vitals reviewed.  Constitutional:      General: She is not in acute distress.    Appearance: Normal appearance. She is not toxic-appearing.  HENT:  Head: Normocephalic and atraumatic.     Right Ear: External ear normal.     Left Ear: External ear normal.     Nose: Nose normal. No congestion or rhinorrhea.     Mouth/Throat:     Mouth: Mucous membranes are moist.     Pharynx: Oropharynx is clear. No oropharyngeal exudate or posterior oropharyngeal erythema.  Eyes:     General: No scleral icterus.    Extraocular Movements: Extraocular movements intact.     Conjunctiva/sclera: Conjunctivae normal.     Pupils: Pupils are equal, round, and reactive to light.  Cardiovascular:     Rate and Rhythm: Normal rate and regular rhythm.     Pulses: Normal pulses.     Heart sounds: Normal heart sounds. No murmur heard.    No friction rub. No gallop.  Pulmonary:     Effort: Pulmonary effort is normal.     Breath sounds: Normal  breath sounds. No wheezing, rhonchi or rales.  Abdominal:     General: Abdomen is flat. Bowel sounds are normal. There is no distension.     Palpations: Abdomen is soft.     Tenderness: There is no abdominal tenderness.  Musculoskeletal:        General: No swelling. Normal range of motion.     Cervical back: Normal range of motion.     Right lower leg: No edema.     Left lower leg: No edema.  Lymphadenopathy:     Cervical: No cervical adenopathy.  Skin:    General: Skin is warm and dry.     Capillary Refill: Capillary refill takes less than 2 seconds.     Coloration: Skin is not jaundiced.     Findings: Rash (Resolving dark, discolored patches on the lower extremities bilaterally.  No erythema appreciated.Marland Kitchen) present.  Neurological:     General: No focal deficit present.     Mental Status: She is alert and oriented to person, place, and time.  Psychiatric:        Mood and Affect: Mood normal.        Behavior: Behavior normal.    Last CBC Lab Results  Component Value Date   WBC 6.0 07/24/2022   HGB 13.2 07/24/2022   HCT 38.9 07/24/2022   MCV 84 07/24/2022   MCH 28.5 07/24/2022   RDW 12.0 07/24/2022   PLT 322 37/34/2876   Last metabolic panel Lab Results  Component Value Date   GLUCOSE 176 (H) 07/24/2022   NA 137 07/24/2022   K 4.4 07/24/2022   CL 98 07/24/2022   CO2 25 07/24/2022   BUN 10 07/24/2022   CREATININE 0.75 07/24/2022   EGFR 87 07/24/2022   CALCIUM 9.6 07/24/2022   PROT 8.1 12/13/2021   ALBUMIN 4.9 (H) 12/13/2021   LABGLOB 3.2 12/13/2021   AGRATIO 1.5 12/13/2021   BILITOT 0.5 12/13/2021   ALKPHOS 104 12/13/2021   AST 20 12/13/2021   ALT 16 12/13/2021   ANIONGAP 7 09/07/2020   Last lipids Lab Results  Component Value Date   CHOL 128 02/19/2022   HDL 43 02/19/2022   LDLCALC 69 02/19/2022   TRIG 79 02/19/2022   CHOLHDL 3.0 02/19/2022   Last hemoglobin A1c Lab Results  Component Value Date   HGBA1C 10.6 (H) 07/24/2022   Last thyroid  functions Lab Results  Component Value Date   TSH 1.560 07/24/2022   Last vitamin D Lab Results  Component Value Date   VD25OH 33.5 07/24/2022     Assessment &  Plan:   Problem List Items Addressed This Visit       Type 2 diabetes mellitus with hyperglycemia ([place]) - Primary    Her most recent A1c was 10.6 last month.  She is currently prescribed metformin 2000 mg daily.  She has previously been prescribed Jardiance 25 mg daily but stopped taking the medication for unclear reasons.  She reports today that she has recently restarted taking Jardiance.  Her blood sugar this morning was 105 and her recent morning readings have been in the 140s. -No medication changes today.  Continue metformin and Jardiance as prescribed -We will follow-up in 2 months for repeat A1c and make further medication adjustments at that time.      Rash and nonspecific skin eruption    Greatly improved compared to evaluation 2 weeks ago following treatment with triamcinolone cream and meloxicam.  I recommended that she continue wearing compression stockings.  No additional medications were prescribed today.      Need for zoster vaccination    Second zoster vaccine administered today       Return in about 2 months (around 10/29/2022) for DM.    Johnette Abraham, MD

## 2022-09-02 ENCOUNTER — Ambulatory Visit (HOSPITAL_COMMUNITY)
Admission: RE | Admit: 2022-09-02 | Discharge: 2022-09-02 | Disposition: A | Discharge status: Home / Self Care | Payer: No Typology Code available for payment source | Source: Ambulatory Visit | Attending: Internal Medicine | Admitting: Internal Medicine

## 2022-09-02 DIAGNOSIS — Z1231 Encounter for screening mammogram for malignant neoplasm of breast: Secondary | ICD-10-CM | POA: Insufficient documentation

## 2022-09-03 DIAGNOSIS — Z23 Encounter for immunization: Secondary | ICD-10-CM | POA: Insufficient documentation

## 2022-09-03 NOTE — Assessment & Plan Note (Signed)
Second zoster vaccine administered today 

## 2022-09-03 NOTE — Assessment & Plan Note (Signed)
Greatly improved compared to evaluation 2 weeks ago following treatment with triamcinolone cream and meloxicam.  I recommended that she continue wearing compression stockings.  No additional medications were prescribed today.

## 2022-09-03 NOTE — Assessment & Plan Note (Signed)
Her most recent A1c was 10.6 last month.  She is currently prescribed metformin 2000 mg daily.  She has previously been prescribed Jardiance 25 mg daily but stopped taking the medication for unclear reasons.  She reports today that she has recently restarted taking Jardiance.  Her blood sugar this morning was 105 and her recent morning readings have been in the 140s. -No medication changes today.  Continue metformin and Jardiance as prescribed -We will follow-up in 2 months for repeat A1c and make further medication adjustments at that time.

## 2022-10-31 ENCOUNTER — Ambulatory Visit: Payer: No Typology Code available for payment source | Admitting: Internal Medicine

## 2022-11-07 ENCOUNTER — Ambulatory Visit: Payer: No Typology Code available for payment source | Admitting: Internal Medicine

## 2022-11-07 ENCOUNTER — Encounter: Payer: Self-pay | Admitting: Internal Medicine

## 2022-11-07 VITALS — BP 125/77 | HR 80 | Ht 62.5 in | Wt 155.0 lb

## 2022-11-07 DIAGNOSIS — L2089 Other atopic dermatitis: Secondary | ICD-10-CM | POA: Diagnosis not present

## 2022-11-07 DIAGNOSIS — M79604 Pain in right leg: Secondary | ICD-10-CM

## 2022-11-07 DIAGNOSIS — M79605 Pain in left leg: Secondary | ICD-10-CM

## 2022-11-07 DIAGNOSIS — E1165 Type 2 diabetes mellitus with hyperglycemia: Secondary | ICD-10-CM

## 2022-11-07 LAB — POCT GLYCOSYLATED HEMOGLOBIN (HGB A1C): HbA1c, POC (controlled diabetic range): 8.2 % — AB (ref 0.0–7.0)

## 2022-11-07 MED ORDER — TRIAMCINOLONE ACETONIDE 0.1 % EX CREA: 1.0000 | TOPICAL_CREAM | Freq: Two times a day (BID) | CUTANEOUS | 1 refills | Status: AC

## 2022-11-07 NOTE — Assessment & Plan Note (Signed)
She endorses occasional recurrence of of the previously treated rash on her left lower leg.   -Triamcinolone cream has been refilled today.

## 2022-11-07 NOTE — Progress Notes (Signed)
Established Patient Office Visit  Subjective   Patient ID: Jennifer Velasquez, female    DOB: 02-Feb-1956  Age: 67 y.o. MRN: [id number]  Chief Complaint  Patient presents with   Diabetes    Follow up   Jennifer Velasquez returns here today for diabetes follow-up.  She was last seen on 08/29/22.  At that time she reported taking metformin 2000 mg daily and stated that she had recently resumed taking Jardiance 25 mg daily.  She had previously stopped taking Jardiance for unclear reasons.  36-month follow-up was arranged for repeat A1c and further discussion regarding appropriate medication adjustments.  There have been no acute interval events.  Jennifer Velasquez reports feeling well today.  She is asymptomatic and has no acute concerns to discuss.  Past Medical History:  Diagnosis Date   Back pain    CARPAL TUNNEL SYNDROME, LEFT 06/02/2008   Qualifier: Diagnosis of  By: Jonna Munro MD, Cornelius     COVID-19 virus infection 09/29/2019   DEPRESSION, SITUATIONAL 11/25/2008   Qualifier: Diagnosis of  Problem Stop Reason:  By: Jonna Munro MD, Julieta Bellini about COVID-19 virus infection 10/21/2019   LYMPHADENOPATHY 05/28/2010   Qualifier: Diagnosis of  By: Claybon Jabs PA, Dawn     Neck pain    left    Need for prophylactic hormone replacement therapy (postmenopausal)    Obesity, unspecified    Other and unspecified hyperlipidemia    Routine general medical examination at a health care facility    Trichomonal vaginitis    Type II or unspecified type diabetes mellitus without mention of complication, not stated as uncontrolled    Unspecified constipation    Unspecified essential hypertension    Past Surgical History:  Procedure Laterality Date   APPENDECTOMY     DENTAL SURGERY     TOTAL ABDOMINAL HYSTERECTOMY     fibroids   Social History   Tobacco Use   Smoking status: Former    Types: Cigarettes   Smokeless tobacco: Never  Vaping Use   Vaping Use: Never used  Substance Use Topics   Alcohol use: Yes    Drug use: No   Family History  Problem Relation Age of Onset   Pneumonia Mother    Transient ischemic attack Father    Hypertension Father    Cancer Father    Hypertension Sister    Hypertension Sister    Cancer Daughter        breast    Allergies  Allergen Reactions   Pravastatin Sodium     REACTION: sore throat   Review of Systems  Constitutional:  Negative for chills and fever.  HENT:  Negative for sore throat.   Respiratory:  Negative for cough and shortness of breath.   Cardiovascular:  Negative for chest pain, palpitations and leg swelling.  Gastrointestinal:  Negative for abdominal pain, blood in stool, constipation, diarrhea, nausea and vomiting.  Genitourinary:  Negative for dysuria and hematuria.  Musculoskeletal:  Negative for myalgias.  Skin:  Negative for itching and rash.  Neurological:  Negative for dizziness and headaches.  Psychiatric/Behavioral:  Negative for depression and suicidal ideas.      Objective:     BP 125/77   Pulse 80   Ht 5' 2.5" (1.588 m)   Wt 155 lb (70.3 kg)   SpO2 97%   BMI 27.90 kg/m  BP Readings from Last 3 Encounters:  11/07/22 125/77  08/29/22 112/72  08/16/22 125/76   Physical Exam Vitals reviewed.  Constitutional:  General: She is not in acute distress.    Appearance: Normal appearance. She is not toxic-appearing.  HENT:     Head: Normocephalic and atraumatic.     Right Ear: External ear normal.     Left Ear: External ear normal.     Nose: Nose normal. No congestion or rhinorrhea.     Mouth/Throat:     Mouth: Mucous membranes are moist.     Pharynx: Oropharynx is clear. No oropharyngeal exudate or posterior oropharyngeal erythema.  Eyes:     General: No scleral icterus.    Extraocular Movements: Extraocular movements intact.     Conjunctiva/sclera: Conjunctivae normal.     Pupils: Pupils are equal, round, and reactive to light.  Cardiovascular:     Rate and Rhythm: Normal rate and regular rhythm.      Pulses: Normal pulses.     Heart sounds: Normal heart sounds. No murmur heard.    No friction rub. No gallop.  Pulmonary:     Effort: Pulmonary effort is normal.     Breath sounds: Normal breath sounds. No wheezing, rhonchi or rales.  Abdominal:     General: Abdomen is flat. Bowel sounds are normal. There is no distension.     Palpations: Abdomen is soft.     Tenderness: There is no abdominal tenderness.  Musculoskeletal:        General: No swelling. Normal range of motion.     Cervical back: Normal range of motion.     Right lower leg: No edema.     Left lower leg: No edema.  Lymphadenopathy:     Cervical: No cervical adenopathy.  Skin:    General: Skin is warm and dry.     Capillary Refill: Capillary refill takes less than 2 seconds.     Coloration: Skin is not jaundiced.     Findings: Lesion (Dark discolored, nonerythematous patch on medial aspect of left lower leg) present.  Neurological:     General: No focal deficit present.     Mental Status: She is alert and oriented to person, place, and time.  Psychiatric:        Mood and Affect: Mood normal.        Behavior: Behavior normal.    Diabetic foot exam was performed.  No deformities or other abnormal visual findings.  Posterior tibialis and dorsalis pulse intact bilaterally.  Intact to touch and monofilament testing bilaterally.    Last CBC Lab Results  Component Value Date   WBC 6.0 07/24/2022   HGB 13.2 07/24/2022   HCT 38.9 07/24/2022   MCV 84 07/24/2022   MCH 28.5 07/24/2022   RDW 12.0 07/24/2022   PLT 322 18/29/9371   Last metabolic panel Lab Results  Component Value Date   GLUCOSE 176 (H) 07/24/2022   NA 137 07/24/2022   K 4.4 07/24/2022   CL 98 07/24/2022   CO2 25 07/24/2022   BUN 10 07/24/2022   CREATININE 0.75 07/24/2022   EGFR 87 07/24/2022   CALCIUM 9.6 07/24/2022   PROT 8.1 12/13/2021   ALBUMIN 4.9 (H) 12/13/2021   LABGLOB 3.2 12/13/2021   AGRATIO 1.5 12/13/2021   BILITOT 0.5 12/13/2021    ALKPHOS 104 12/13/2021   AST 20 12/13/2021   ALT 16 12/13/2021   ANIONGAP 7 09/07/2020   Last lipids Lab Results  Component Value Date   CHOL 128 02/19/2022   HDL 43 02/19/2022   LDLCALC 69 02/19/2022   TRIG 79 02/19/2022   CHOLHDL 3.0 02/19/2022  Last hemoglobin A1c Lab Results  Component Value Date   HGBA1C 8.2 (A) 11/07/2022   Last thyroid functions Lab Results  Component Value Date   TSH 1.560 07/24/2022   Last vitamin D Lab Results  Component Value Date   VD25OH 33.5 07/24/2022     Assessment & Plan:   Problem List Items Addressed This Visit       Type 2 diabetes mellitus with hyperglycemia ([facility]) - Primary    Her A1c has improved to 8.2 from 10.6 previously.  She is currently prescribed metformin XR 2000 mg daily and Jardiance 25 mg daily.  She had previously stopped taking Jardiance.  Symptoms of polyuria/polydipsia.  She believes she has additional room for improvement from a dietary standpoint. -No medication changes today.  Continue metformin and Jardiance as currently prescribed. -Follow-up in 4 months for her annual exam and plan to repeat A1c at that time. -Diabetic foot exam completed today      Atopic dermatitis    She endorses occasional recurrence of of the previously treated rash on her left lower leg.   -Triamcinolone cream has been refilled today.      Return in about 4 months (around 03/08/2023) for CPE.   Billie Lade, MD

## 2022-11-07 NOTE — Assessment & Plan Note (Addendum)
Her A1c has improved to 8.2 from 10.6 previously.  She is currently prescribed metformin XR 2000 mg daily and Jardiance 25 mg daily.  She had previously stopped taking Jardiance.  Symptoms of polyuria/polydipsia.  She believes she has additional room for improvement from a dietary standpoint. -No medication changes today.  Continue metformin and Jardiance as currently prescribed. -Follow-up in 4 months for her annual exam and plan to repeat A1c at that time. -Diabetic foot exam completed today

## 2022-11-07 NOTE — Patient Instructions (Signed)
It was a pleasure to see you today.  Thank you for giving [place] the opportunity to be involved in your care.  Below is a brief recap of your visit and next steps.  We will plan to see you again in 4 months.  Summary No medication changes today I have refilled triamcinolone cream We will follow up in 4 months for your annual exam

## 2022-12-02 ENCOUNTER — Other Ambulatory Visit: Payer: Self-pay

## 2022-12-02 DIAGNOSIS — E1165 Type 2 diabetes mellitus with hyperglycemia: Secondary | ICD-10-CM

## 2022-12-02 MED ORDER — METFORMIN HCL ER 500 MG PO TB24: 2000.0000 mg | ORAL_TABLET | Freq: Every day | ORAL | 0 refills | Status: DC

## 2023-01-21 ENCOUNTER — Other Ambulatory Visit: Payer: Self-pay

## 2023-01-21 MED ORDER — VITAMIN D 25 MCG (1000 UNIT) PO TABS: 1000.0000 [IU] | ORAL_TABLET | Freq: Every day | ORAL | 1 refills | Status: DC

## 2023-03-07 ENCOUNTER — Other Ambulatory Visit: Payer: Self-pay | Admitting: Internal Medicine

## 2023-03-07 DIAGNOSIS — E1165 Type 2 diabetes mellitus with hyperglycemia: Secondary | ICD-10-CM

## 2023-03-13 ENCOUNTER — Encounter: Payer: Self-pay | Admitting: Internal Medicine

## 2023-03-13 ENCOUNTER — Ambulatory Visit (HOSPITAL_COMMUNITY)
Admission: RE | Admit: 2023-03-13 | Discharge: 2023-03-13 | Disposition: A | Discharge status: Home / Self Care | Payer: No Typology Code available for payment source | Source: Ambulatory Visit | Attending: Internal Medicine | Admitting: Internal Medicine

## 2023-03-13 ENCOUNTER — Ambulatory Visit (INDEPENDENT_AMBULATORY_CARE_PROVIDER_SITE_OTHER): Payer: 59 | Admitting: Internal Medicine

## 2023-03-13 VITALS — BP 115/75 | HR 83 | Resp 16 | Ht 62.5 in | Wt 155.8 lb

## 2023-03-13 DIAGNOSIS — I10 Essential (primary) hypertension: Secondary | ICD-10-CM

## 2023-03-13 DIAGNOSIS — Z0001 Encounter for general adult medical examination with abnormal findings: Secondary | ICD-10-CM

## 2023-03-13 DIAGNOSIS — R300 Dysuria: Secondary | ICD-10-CM

## 2023-03-13 DIAGNOSIS — M79641 Pain in right hand: Secondary | ICD-10-CM

## 2023-03-13 DIAGNOSIS — E1169 Type 2 diabetes mellitus with other specified complication: Secondary | ICD-10-CM

## 2023-03-13 DIAGNOSIS — Z7984 Long term (current) use of oral hypoglycemic drugs: Secondary | ICD-10-CM | POA: Diagnosis not present

## 2023-03-13 DIAGNOSIS — E559 Vitamin D deficiency, unspecified: Secondary | ICD-10-CM | POA: Diagnosis not present

## 2023-03-13 DIAGNOSIS — E1165 Type 2 diabetes mellitus with hyperglycemia: Secondary | ICD-10-CM

## 2023-03-13 DIAGNOSIS — M25552 Pain in left hip: Secondary | ICD-10-CM

## 2023-03-13 DIAGNOSIS — E785 Hyperlipidemia, unspecified: Secondary | ICD-10-CM | POA: Diagnosis not present

## 2023-03-13 LAB — POCT URINALYSIS DIP (CLINITEK)
Bilirubin, UA: NEGATIVE
Glucose, UA: 500 mg/dL — AB
Ketones, POC UA: NEGATIVE mg/dL
Leukocytes, UA: NEGATIVE
Nitrite, UA: NEGATIVE
POC PROTEIN,UA: NEGATIVE
Spec Grav, UA: 1.015 (ref 1.010–1.025)
Urobilinogen, UA: 0.2 E.U./dL
pH, UA: 5.5 (ref 5.0–8.0)

## 2023-03-13 NOTE — Assessment & Plan Note (Signed)
A1c 8.2 in January.  She is currently prescribed metformin XR 2000 mg daily and Jardiance 25 mg daily. -Repeat A1c ordered today -Ophthalmology appointment for diabetic eye exam is scheduled for June

## 2023-03-13 NOTE — Assessment & Plan Note (Signed)
Annual exam completed today. -Repeat labs ordered -Vaccinations and cancer screenings are up-to-date -She has an ophthalmology appointment scheduled for diabetic eye exam next month -We will tentatively plan for follow-up in 6 months.

## 2023-03-13 NOTE — Assessment & Plan Note (Signed)
She endorses a 1 week history of intermittent dysuria.  Denies additional symptoms.  POC UA completed today is not concerning for infection. -She was instructed return to care if her symptoms worsen or do not resolve within the next week.

## 2023-03-13 NOTE — Assessment & Plan Note (Signed)
She is currently prescribed rosuvastatin 10 mg daily.  Lipid panel last updated in May 2023. -Repeat lipid panel ordered today

## 2023-03-13 NOTE — Progress Notes (Signed)
Complete physical exam  Patient: Jennifer Velasquez   DOB: Nov 18, 1955   67 y.o. Female  MRN: [id number]  Subjective:    Chief Complaint  Patient presents with   Annual Exam   Urinary Tract Infection    Dysuria that comes and goes for awhile now. Wants to be checked for uti    Jennifer Velasquez 3 weeks ago and landed on her right hand and when she wakes up in the am she can hardly make a fist but it gets better as the day goes on    Leg Pain    Left leg has been hurting in the upper leg groin area and spreads down her leg to her knee     Jennifer Velasquez is a 67 y.o. female who presents today for a complete physical exam. She reports consuming a general diet. Home exercise routine includes walking 1 hrs per day, 3 days per week.. She generally feels well. She reports sleeping well. She does have additional problems to discuss today.   Jennifer Velasquez reports a recent fall, catching herself on her right hand.  She endorses a history of right lateral hand pain since that time, noting difficulty making a fist in the morning.  Symptoms seem to improve throughout the day.  She additionally endorses left anterior hip pain, noting exacerbation of pain with ambulation and prolonged standing.  Symptoms seem to have worsened since her recent fall.  She lastly endorses intermittent dysuria for the past week.  Denies fever/chills, hematuria, abnormal coloration or foul odor of urine, and flank pain.  Most recent fall risk assessment:    03/13/2023    9:51 AM  Fall Risk   Falls in the past year? 1  Number falls in past yr: 0  Injury with Fall? 0     Most recent depression screenings:    03/13/2023    9:51 AM 11/07/2022    4:30 PM  PHQ 2/9 Scores  PHQ - 2 Score 0 0  PHQ- 9 Score  0    Vision:Not within last year  and has an appointment in June and Dental: No current dental problems and Receives regular dental care  Past Medical History:  Diagnosis Date   Back pain    CARPAL TUNNEL SYNDROME, LEFT 06/02/2008    Qualifier: Diagnosis of  By: Erby Pian MD, Cornelius     COVID-19 virus infection 09/29/2019   DEPRESSION, SITUATIONAL 11/25/2008   Qualifier: Diagnosis of  Problem Stop Reason:  By: Erby Pian MD, Jamelle Rushing about COVID-19 virus infection 10/21/2019   LYMPHADENOPATHY 05/28/2010   Qualifier: Diagnosis of  By: Garnette Czech PA, Dawn     Neck pain    left    Need for prophylactic hormone replacement therapy (postmenopausal)    Obesity, unspecified    Other and unspecified hyperlipidemia    Routine general medical examination at a health care facility    Trichomonal vaginitis    Type II or unspecified type diabetes mellitus without mention of complication, not stated as uncontrolled    Unspecified constipation    Unspecified essential hypertension    Past Surgical History:  Procedure Laterality Date   APPENDECTOMY     DENTAL SURGERY     TOTAL ABDOMINAL HYSTERECTOMY     fibroids   Social History   Tobacco Use   Smoking status: Former    Types: Cigarettes   Smokeless tobacco: Never  Vaping Use   Vaping Use: Never used  Substance Use Topics   Alcohol use: Yes   Drug use: No   Family History  Problem Relation Age of Onset   Pneumonia Mother    Transient ischemic attack Father    Hypertension Father    Cancer Father    Hypertension Sister    Hypertension Sister    Cancer Daughter        breast    Allergies  Allergen Reactions   Pravastatin Sodium     REACTION: sore throat   Patient Care Team: Billie Lade, MD as PCP - General (Internal Medicine) Gavin Pound, [facility] (Inactive) (Pharmacist)   Outpatient Medications Prior to Visit  Medication Sig   cholecalciferol (VITAMIN D3) 25 MCG (1000 UNIT) tablet Take 1 tablet (1,000 Units total) by mouth daily.   diphenhydrAMINE (BENADRYL) 25 MG tablet Take 25 mg by mouth every 6 (six) hours as needed for itching.   empagliflozin (JARDIANCE) 25 MG TABS tablet Take 1 tablet (25 mg total) by mouth daily before  breakfast.   lisinopril (ZESTRIL) 20 MG tablet Take 1 tablet (20 mg total) by mouth daily.   metFORMIN (GLUCOPHAGE-XR) 500 MG 24 hr tablet TAKE 4 TABLETS BY MOUTH EVERY DAY WITH BREAKFAST   Multiple Vitamin (MULTIVITAMIN WITH MINERALS) TABS tablet Take 1 tablet by mouth daily.   rosuvastatin (CRESTOR) 10 MG tablet Take 1 tablet (10 mg total) by mouth daily.   triamcinolone cream (KENALOG) 0.1 % Apply 1 Application topically 2 (two) times daily.   TRUE METRIX BLOOD GLUCOSE TEST test strip    TRUEplus Lancets 30G MISC    No facility-administered medications prior to visit.   Review of Systems  Constitutional:  Negative for chills and fever.  HENT:  Negative for sore throat.   Respiratory:  Negative for cough and shortness of breath.   Cardiovascular:  Negative for chest pain, palpitations and leg swelling.  Gastrointestinal:  Negative for abdominal pain, blood in stool, constipation, diarrhea, nausea and vomiting.  Genitourinary:  Positive for dysuria. Negative for flank pain, frequency, hematuria and urgency.  Musculoskeletal:  Positive for falls (recent fall 3 weeks ago) and joint pain (left hip pain). Negative for myalgias.       Right lateral hand pain  Skin:  Negative for itching and rash.  Neurological:  Negative for dizziness and headaches.  Psychiatric/Behavioral:  Negative for depression and suicidal ideas.       Objective:     BP 115/75   Pulse 83   Resp 16   Ht 5' 2.5" (1.588 m)   Wt 155 lb 12.8 oz (70.7 kg)   SpO2 98%   BMI 28.04 kg/m  BP Readings from Last 3 Encounters:  03/13/23 115/75  11/07/22 125/77  08/29/22 112/72   Physical Exam Vitals reviewed.  Constitutional:      General: She is not in acute distress.    Appearance: Normal appearance. She is not toxic-appearing.  HENT:     Head: Normocephalic and atraumatic.     Right Ear: External ear normal.     Left Ear: External ear normal.     Nose: Nose normal. No congestion or rhinorrhea.      Mouth/Throat:     Mouth: Mucous membranes are moist.     Pharynx: Oropharynx is clear. No oropharyngeal exudate or posterior oropharyngeal erythema.  Eyes:     General: No scleral icterus.    Extraocular Movements: Extraocular movements intact.     Conjunctiva/sclera: Conjunctivae normal.     Pupils: Pupils are  equal, round, and reactive to light.  Cardiovascular:     Rate and Rhythm: Normal rate and regular rhythm.     Pulses: Normal pulses.     Heart sounds: Normal heart sounds. No murmur heard.    No friction rub. No gallop.  Pulmonary:     Effort: Pulmonary effort is normal.     Breath sounds: Normal breath sounds. No wheezing, rhonchi or rales.  Abdominal:     General: Abdomen is flat. Bowel sounds are normal. There is no distension.     Palpations: Abdomen is soft.     Tenderness: There is no abdominal tenderness.  Musculoskeletal:        General: Signs of injury (Pain elicited with flexion and FADIR of the left hip) present. No swelling.     Cervical back: Normal range of motion.     Right lower leg: No edema.     Left lower leg: No edema.     Comments: No obvious deformity on inspection of the right hand.  Grip strength is diminished compared to the left hand.  Lymphadenopathy:     Cervical: No cervical adenopathy.  Skin:    General: Skin is warm and dry.     Capillary Refill: Capillary refill takes less than 2 seconds.     Coloration: Skin is not jaundiced.  Neurological:     General: No focal deficit present.     Mental Status: She is alert and oriented to person, place, and time.  Psychiatric:        Mood and Affect: Mood normal.        Behavior: Behavior normal.   Last CBC Lab Results  Component Value Date   WBC 6.0 07/24/2022   HGB 13.2 07/24/2022   HCT 38.9 07/24/2022   MCV 84 07/24/2022   MCH 28.5 07/24/2022   RDW 12.0 07/24/2022   PLT 322 07/24/2022   Last metabolic panel Lab Results  Component Value Date   GLUCOSE 176 (H) 07/24/2022   NA 137  07/24/2022   K 4.4 07/24/2022   CL 98 07/24/2022   CO2 25 07/24/2022   BUN 10 07/24/2022   CREATININE 0.75 07/24/2022   EGFR 87 07/24/2022   CALCIUM 9.6 07/24/2022   PROT 8.1 12/13/2021   ALBUMIN 4.9 (H) 12/13/2021   LABGLOB 3.2 12/13/2021   AGRATIO 1.5 12/13/2021   BILITOT 0.5 12/13/2021   ALKPHOS 104 12/13/2021   AST 20 12/13/2021   ALT 16 12/13/2021   ANIONGAP 7 09/07/2020   Last lipids Lab Results  Component Value Date   CHOL 128 02/19/2022   HDL 43 02/19/2022   LDLCALC 69 02/19/2022   TRIG 79 02/19/2022   CHOLHDL 3.0 02/19/2022   Last hemoglobin A1c Lab Results  Component Value Date   HGBA1C 8.2 (A) 11/07/2022   Last thyroid functions Lab Results  Component Value Date   TSH 1.560 07/24/2022   Last vitamin D Lab Results  Component Value Date   VD25OH 33.5 07/24/2022      Assessment & Plan:    Routine Health Maintenance and Physical Exam  Immunization History  Administered Date(s) Administered   Fluad Quad(high Dose 65+) 07/20/2020, 09/14/2021, 07/29/2022   H1N1 09/12/2008   Influenza Whole 07/20/2007, 06/30/2008   Influenza,inj,Quad PF,6+ Mos 07/22/2018, 06/28/2019   Moderna SARS-COV2 Booster Vaccination 10/21/2019, 11/18/2019, 08/14/2020, 04/11/2021   PNEUMOCOCCAL CONJUGATE-20 08/09/2022   PPD Test 08/25/2015   Pneumococcal Conjugate-13 03/29/2021   Pneumococcal Polysaccharide-23 04/23/2007   Tdap 02/07/2018   Zoster  Recombinat (Shingrix) 12/13/2021, 08/29/2022    Health Maintenance  Topic Date Due   COVID-19 Vaccine (5 - 2023-24 season) 06/14/2022   OPHTHALMOLOGY EXAM  02/09/2023   HEMOGLOBIN A1C  05/08/2023   INFLUENZA VACCINE  05/15/2023   Diabetic kidney evaluation - eGFR measurement  07/25/2023   Diabetic kidney evaluation - Urine ACR  07/25/2023   Fecal DNA (Cologuard)  08/02/2023   FOOT EXAM  11/08/2023   MAMMOGRAM  09/02/2024   DTaP/Tdap/Td (2 - Td or Tdap) 02/08/2028   Pneumonia Vaccine 39+ Years old  Completed   DEXA SCAN   Completed   Hepatitis C Screening  Completed   Zoster Vaccines- Shingrix  Completed   HPV VACCINES  Aged Out   Colonoscopy  Discontinued    Discussed health benefits of physical activity, and encouraged her to engage in regular exercise appropriate for her age and condition.  Problem List Items Addressed This Visit       Essential hypertension    BP remains well-controlled with lisinopril 20 mg daily.  No medication changes are indicated today.      Type 2 diabetes mellitus with hyperglycemia (HCC)    A1c 8.2 in January.  She is currently prescribed metformin XR 2000 mg daily and Jardiance 25 mg daily. -Repeat A1c ordered today -Ophthalmology appointment for diabetic eye exam is scheduled for June      Hyperlipidemia associated with type 2 diabetes mellitus (HCC)    She is currently prescribed rosuvastatin 10 mg daily.  Lipid panel last updated in May 2023. -Repeat lipid panel ordered today      Vitamin D deficiency    Repeat vitamin D level ordered today      Dysuria    She endorses a 1 week history of intermittent dysuria.  Denies additional symptoms.  POC UA completed today is not concerning for infection. -She was instructed return to care if her symptoms worsen or do not resolve within the next week.      Right hand pain    She reports falling forward 3 weeks ago, catching herself with her right hand.  Since that time she has experienced pain in the lateral aspect of the right hand, over the fifth metacarpal.  No obvious deformity is appreciated on exam today.  Grip strength is slightly reduced compared to the left hand. -Right hand x-ray ordered today      Left hip pain    She endorses left anterior hip pain.  Pain has worsened over the last 3 weeks since her fall and is exacerbated by walking or prolonged standing.  She has not been able to exercise regularly because of pain.  Negative logroll.  Pain is worsened with flexion and FADIR of the left hip. -Left hip xrays  ordered today      Encounter for well adult exam with abnormal findings - Primary    Annual exam completed today. -Repeat labs ordered -Vaccinations and cancer screenings are up-to-date -She has an ophthalmology appointment scheduled for diabetic eye exam next month -We will tentatively plan for follow-up in 6 months.      Return in about 6 months (around 09/13/2023).  Billie Lade, MD

## 2023-03-13 NOTE — Assessment & Plan Note (Signed)
She endorses left anterior hip pain.  Pain has worsened over the last 3 weeks since her fall and is exacerbated by walking or prolonged standing.  She has not been able to exercise regularly because of pain.  Negative logroll.  Pain is worsened with flexion and FADIR of the left hip. -Left hip xrays ordered today

## 2023-03-13 NOTE — Assessment & Plan Note (Signed)
BP remains well-controlled with lisinopril 20 mg daily.  No medication changes are indicated today.

## 2023-03-13 NOTE — Assessment & Plan Note (Signed)
Repeat vitamin D level ordered today 

## 2023-03-13 NOTE — Assessment & Plan Note (Signed)
She reports falling forward 3 weeks ago, catching herself with her right hand.  Since that time she has experienced pain in the lateral aspect of the right hand, over the fifth metacarpal.  No obvious deformity is appreciated on exam today.  Grip strength is slightly reduced compared to the left hand. -Right hand x-ray ordered today

## 2023-03-13 NOTE — Patient Instructions (Signed)
It was a pleasure to see you today.  Thank you for giving [place] the opportunity to be involved in your care.  Below is a brief recap of your visit and next steps.  We will plan to see you again in 6 months.  Summary Annual exam completed today Repeat labs ordered Xrays ordered of you right hand and left hip We will check your urine for infection Follow up in 6 months

## 2023-03-14 ENCOUNTER — Other Ambulatory Visit: Payer: Self-pay | Admitting: Internal Medicine

## 2023-03-14 DIAGNOSIS — E785 Hyperlipidemia, unspecified: Secondary | ICD-10-CM

## 2023-03-14 LAB — CBC WITH DIFFERENTIAL/PLATELET
Basophils Absolute: 0 10*3/uL (ref 0.0–0.2)
Basos: 1 %
EOS (ABSOLUTE): 0.2 10*3/uL (ref 0.0–0.4)
Eos: 3 %
Hematocrit: 42.4 % (ref 34.0–46.6)
Hemoglobin: 14.6 g/dL (ref 11.1–15.9)
Immature Grans (Abs): 0 10*3/uL (ref 0.0–0.1)
Immature Granulocytes: 0 %
Lymphocytes Absolute: 3 10*3/uL (ref 0.7–3.1)
Lymphs: 48 %
MCH: 29.3 pg (ref 26.6–33.0)
MCHC: 34.4 g/dL (ref 31.5–35.7)
MCV: 85 fL (ref 79–97)
Monocytes Absolute: 0.4 10*3/uL (ref 0.1–0.9)
Monocytes: 6 %
Neutrophils Absolute: 2.6 10*3/uL (ref 1.4–7.0)
Neutrophils: 42 %
Platelets: 313 10*3/uL (ref 150–450)
RBC: 4.99 x10E6/uL (ref 3.77–5.28)
RDW: 12.5 % (ref 11.7–15.4)
WBC: 6.1 10*3/uL (ref 3.4–10.8)

## 2023-03-14 LAB — LIPID PANEL
Chol/HDL Ratio: 3.4 ratio (ref 0.0–4.4)
Cholesterol, Total: 153 mg/dL (ref 100–199)
HDL: 45 mg/dL (ref >39)
LDL Chol Calc (NIH): 86 mg/dL (ref 0–99)
Triglycerides: 126 mg/dL (ref 0–149)
VLDL Cholesterol Cal: 22 mg/dL (ref 5–40)

## 2023-03-14 LAB — B12 AND FOLATE PANEL
Folate: 16.1 ng/mL (ref >3.0)
Vitamin B-12: 560 pg/mL (ref 232–1245)

## 2023-03-14 LAB — CMP14+EGFR
ALT: 11 IU/L (ref 0–32)
AST: 10 IU/L (ref 0–40)
Albumin/Globulin Ratio: 1.5 (ref 1.2–2.2)
Albumin: 4.5 g/dL (ref 3.9–4.9)
Alkaline Phosphatase: 86 IU/L (ref 44–121)
BUN/Creatinine Ratio: 17 (ref 12–28)
BUN: 13 mg/dL (ref 8–27)
Bilirubin Total: 0.4 mg/dL (ref 0.0–1.2)
CO2: 22 mmol/L (ref 20–29)
Calcium: 9.9 mg/dL (ref 8.7–10.3)
Chloride: 100 mmol/L (ref 96–106)
Creatinine, Ser: 0.78 mg/dL (ref 0.57–1.00)
Globulin, Total: 3.1 g/dL (ref 1.5–4.5)
Glucose: 129 mg/dL — ABNORMAL HIGH (ref 70–99)
Potassium: 4.4 mmol/L (ref 3.5–5.2)
Sodium: 139 mmol/L (ref 134–144)
Total Protein: 7.6 g/dL (ref 6.0–8.5)
eGFR: 83 mL/min/{1.73_m2} (ref >59)

## 2023-03-14 LAB — TSH+FREE T4
Free T4: 1.23 ng/dL (ref 0.82–1.77)
TSH: 1.37 u[IU]/mL (ref 0.450–4.500)

## 2023-03-14 LAB — HEMOGLOBIN A1C
Est. average glucose Bld gHb Est-mCnc: 212 mg/dL
Hgb A1c MFr Bld: 9 % — ABNORMAL HIGH (ref 4.8–5.6)

## 2023-03-14 LAB — VITAMIN D 25 HYDROXY (VIT D DEFICIENCY, FRACTURES): Vit D, 25-Hydroxy: 33.7 ng/mL (ref 30.0–100.0)

## 2023-03-14 MED ORDER — ROSUVASTATIN CALCIUM 20 MG PO TABS
20.0000 mg | ORAL_TABLET | Freq: Every day | ORAL | 3 refills | Status: DC
Start: 2023-03-14 — End: 2024-02-23

## 2023-04-10 ENCOUNTER — Ambulatory Visit: Payer: No Typology Code available for payment source | Admitting: Internal Medicine

## 2023-04-18 DIAGNOSIS — Z7984 Long term (current) use of oral hypoglycemic drugs: Secondary | ICD-10-CM | POA: Diagnosis not present

## 2023-04-18 DIAGNOSIS — L239 Allergic contact dermatitis, unspecified cause: Secondary | ICD-10-CM | POA: Diagnosis not present

## 2023-04-18 DIAGNOSIS — E119 Type 2 diabetes mellitus without complications: Secondary | ICD-10-CM | POA: Diagnosis not present

## 2023-05-01 LAB — HM DIABETES EYE EXAM

## 2023-05-06 ENCOUNTER — Other Ambulatory Visit: Payer: Self-pay | Admitting: Internal Medicine

## 2023-05-06 DIAGNOSIS — E1165 Type 2 diabetes mellitus with hyperglycemia: Secondary | ICD-10-CM

## 2023-05-08 ENCOUNTER — Ambulatory Visit (INDEPENDENT_AMBULATORY_CARE_PROVIDER_SITE_OTHER): Payer: 59 | Admitting: Internal Medicine

## 2023-05-08 ENCOUNTER — Encounter: Payer: Self-pay | Admitting: Internal Medicine

## 2023-05-08 VITALS — BP 105/67 | HR 79 | Ht 62.5 in | Wt 153.6 lb

## 2023-05-08 DIAGNOSIS — E1169 Type 2 diabetes mellitus with other specified complication: Secondary | ICD-10-CM | POA: Diagnosis not present

## 2023-05-08 DIAGNOSIS — E785 Hyperlipidemia, unspecified: Secondary | ICD-10-CM

## 2023-05-08 DIAGNOSIS — E1165 Type 2 diabetes mellitus with hyperglycemia: Secondary | ICD-10-CM | POA: Diagnosis not present

## 2023-05-08 NOTE — Assessment & Plan Note (Signed)
Presenting today for follow-up of diabetes mellitus.  A1c increased to 9.0 from 8.20 previously on labs in May.  She is currently prescribed metformin XR 1000 mg twice daily and Jardiance 25 mg daily.  She reports that she has not been taking metformin in the evening consistently and also acknowledges that there is room for significant improvement from a dietary perspective. -No medication changes have been made today.  Jennifer Velasquez was strongly encouraged to take metformin 1000 mg twice daily.  I also offered to place referral to medical nutrition therapy as she acknowledges that there is room for significant improvement from a dietary standpoint.  For now, she would like to focus on making dietary changes on her own.  We will repeat an A1c at follow-up in November.  If there is no improvement in her A1c, consider medication adjustments and a referral to medical nutrition therapy.

## 2023-05-08 NOTE — Progress Notes (Signed)
Established Patient Office Visit  Subjective   Patient ID: Jennifer Velasquez, female    DOB: 1956/03/11  Age: 67 y.o. MRN: [id number]  Chief Complaint  Patient presents with   Diabetes    Follow up   Hyperlipidemia    Follow up   Jennifer Velasquez returns here today for follow-up of diabetes mellitus and hyperlipidemia.  She was last evaluated by me on 5/30 for her annual exam.  Repeat labs were obtained at that time and showed that her A1c had increased to 9.0.  Her LDL remained elevated as well.  Rosuvastatin was increased to 20 mg daily and 4-week follow-up was arranged for diabetes management.  There have been no acute interval events.  Jennifer Velasquez reports feeling well today.  She is asymptomatic and has no acute concerns to discuss.  Past Medical History:  Diagnosis Date   Back pain    CARPAL TUNNEL SYNDROME, LEFT 06/02/2008   Qualifier: Diagnosis of  By: Erby Pian MD, Cornelius     COVID-19 virus infection 09/29/2019   DEPRESSION, SITUATIONAL 11/25/2008   Qualifier: Diagnosis of  Problem Stop Reason:  By: Erby Pian MD, Jamelle Rushing about COVID-19 virus infection 10/21/2019   LYMPHADENOPATHY 05/28/2010   Qualifier: Diagnosis of  By: Garnette Czech PA, Dawn     Neck pain    left    Need for prophylactic hormone replacement therapy (postmenopausal)    Obesity, unspecified    Other and unspecified hyperlipidemia    Routine general medical examination at a health care facility    Trichomonal vaginitis    Type II or unspecified type diabetes mellitus without mention of complication, not stated as uncontrolled    Unspecified constipation    Unspecified essential hypertension    Past Surgical History:  Procedure Laterality Date   APPENDECTOMY     DENTAL SURGERY     TOTAL ABDOMINAL HYSTERECTOMY     fibroids   Social History   Tobacco Use   Smoking status: Former    Types: Cigarettes   Smokeless tobacco: Never  Vaping Use   Vaping status: Never Used  Substance Use Topics   Alcohol use: Yes    Drug use: No   Family History  Problem Relation Age of Onset   Pneumonia Mother    Transient ischemic attack Father    Hypertension Father    Cancer Father    Hypertension Sister    Hypertension Sister    Cancer Daughter        breast    Allergies  Allergen Reactions   Pravastatin Sodium     REACTION: sore throat   Review of Systems  Constitutional:  Negative for chills and fever.  HENT:  Negative for sore throat.   Respiratory:  Negative for cough and shortness of breath.   Cardiovascular:  Negative for chest pain, palpitations and leg swelling.  Gastrointestinal:  Negative for abdominal pain, blood in stool, constipation, diarrhea, nausea and vomiting.  Genitourinary:  Negative for dysuria and hematuria.  Musculoskeletal:  Negative for myalgias.  Skin:  Negative for itching and rash.  Neurological:  Negative for dizziness and headaches.  Psychiatric/Behavioral:  Negative for depression and suicidal ideas.      Objective:     BP 105/67   Pulse 79   Ht 5' 2.5" (1.588 m)   Wt 153 lb 9.6 oz (69.7 kg)   SpO2 91%   BMI 27.65 kg/m  BP Readings from Last 3 Encounters:  05/08/23 105/67  03/13/23  115/75  11/07/22 125/77   Physical Exam Vitals reviewed.  Constitutional:      General: She is not in acute distress.    Appearance: Normal appearance. She is not toxic-appearing.  HENT:     Head: Normocephalic and atraumatic.     Right Ear: External ear normal.     Left Ear: External ear normal.     Nose: Nose normal. No congestion or rhinorrhea.     Mouth/Throat:     Mouth: Mucous membranes are moist.     Pharynx: Oropharynx is clear. No oropharyngeal exudate or posterior oropharyngeal erythema.  Eyes:     General: No scleral icterus.    Extraocular Movements: Extraocular movements intact.     Conjunctiva/sclera: Conjunctivae normal.     Pupils: Pupils are equal, round, and reactive to light.  Cardiovascular:     Rate and Rhythm: Normal rate and regular rhythm.      Pulses: Normal pulses.     Heart sounds: Normal heart sounds. No murmur heard.    No friction rub. No gallop.  Pulmonary:     Effort: Pulmonary effort is normal.     Breath sounds: Normal breath sounds. No wheezing, rhonchi or rales.  Abdominal:     General: Abdomen is flat. Bowel sounds are normal. There is no distension.     Palpations: Abdomen is soft.     Tenderness: There is no abdominal tenderness.  Musculoskeletal:        General: No swelling. Normal range of motion.     Cervical back: Normal range of motion.     Right lower leg: No edema.     Left lower leg: No edema.  Lymphadenopathy:     Cervical: No cervical adenopathy.  Skin:    General: Skin is warm and dry.     Capillary Refill: Capillary refill takes less than 2 seconds.     Coloration: Skin is not jaundiced.  Neurological:     General: No focal deficit present.     Mental Status: She is alert and oriented to person, place, and time.  Psychiatric:        Mood and Affect: Mood normal.        Behavior: Behavior normal.   Last CBC Lab Results  Component Value Date   WBC 6.1 03/13/2023   HGB 14.6 03/13/2023   HCT 42.4 03/13/2023   MCV 85 03/13/2023   MCH 29.3 03/13/2023   RDW 12.5 03/13/2023   PLT 313 03/13/2023   Last metabolic panel Lab Results  Component Value Date   GLUCOSE 129 (H) 03/13/2023   NA 139 03/13/2023   K 4.4 03/13/2023   CL 100 03/13/2023   CO2 22 03/13/2023   BUN 13 03/13/2023   CREATININE 0.78 03/13/2023   EGFR 83 03/13/2023   CALCIUM 9.9 03/13/2023   PROT 7.6 03/13/2023   ALBUMIN 4.5 03/13/2023   LABGLOB 3.1 03/13/2023   AGRATIO 1.5 03/13/2023   BILITOT 0.4 03/13/2023   ALKPHOS 86 03/13/2023   AST 10 03/13/2023   ALT 11 03/13/2023   ANIONGAP 7 09/07/2020   Last lipids Lab Results  Component Value Date   CHOL 153 03/13/2023   HDL 45 03/13/2023   LDLCALC 86 03/13/2023   TRIG 126 03/13/2023   CHOLHDL 3.4 03/13/2023   Last hemoglobin A1c Lab Results  Component  Value Date   HGBA1C 9.0 (H) 03/13/2023   Last thyroid functions Lab Results  Component Value Date   TSH 1.370 03/13/2023   Last  vitamin D Lab Results  Component Value Date   VD25OH 33.7 03/13/2023   Last vitamin B12 and Folate Lab Results  Component Value Date   VITAMINB12 560 03/13/2023   FOLATE 16.1 03/13/2023   The 10-year ASCVD risk score (Arnett DK, et al., 2019) is: 12.3%    Assessment & Plan:   Problem List Items Addressed This Visit       Type 2 diabetes mellitus with hyperglycemia (HCC) - Primary    Presenting today for follow-up of diabetes mellitus.  A1c increased to 9.0 from 8.20 previously on labs in May.  She is currently prescribed metformin XR 1000 mg twice daily and Jardiance 25 mg daily.  She reports that she has not been taking metformin in the evening consistently and also acknowledges that there is room for significant improvement from a dietary perspective. -No medication changes have been made today.  Jennifer Velasquez was strongly encouraged to take metformin 1000 mg twice daily.  I also offered to place referral to medical nutrition therapy as she acknowledges that there is room for significant improvement from a dietary standpoint.  For now, she would like to focus on making dietary changes on her own.  We will repeat an A1c at follow-up in November.  If there is no improvement in her A1c, consider medication adjustments and a referral to medical nutrition therapy.      Hyperlipidemia associated with type 2 diabetes mellitus (HCC)    Lipid panel updated in May.  Total cholesterol 153 and LDL 86.  Rosuvastatin was increased to 20 mg daily at that time for improved LDL management in the setting of diabetes mellitus.  She has not experienced any adverse side effects since increasing rosuvastatin.  We will plan to repeat a lipid panel at follow-up in November.       Return in about 17 weeks (around 09/04/2023).    Billie Lade, MD

## 2023-05-08 NOTE — Patient Instructions (Signed)
It was a pleasure to see you today.  Thank you for giving [place] the opportunity to be involved in your care.  Below is a brief recap of your visit and next steps.  We will plan to see you again in November.  Summary No medication changes today Please take metformin twice daily Focus on dietary changes that will improve your diabetes Follow up in November as scheduled

## 2023-05-08 NOTE — Assessment & Plan Note (Signed)
Lipid panel updated in May.  Total cholesterol 153 and LDL 86.  Rosuvastatin was increased to 20 mg daily at that time for improved LDL management in the setting of diabetes mellitus.  She has not experienced any adverse side effects since increasing rosuvastatin.  We will plan to repeat a lipid panel at follow-up in November.

## 2023-05-30 ENCOUNTER — Other Ambulatory Visit: Payer: Self-pay | Admitting: Internal Medicine

## 2023-05-30 DIAGNOSIS — E1165 Type 2 diabetes mellitus with hyperglycemia: Secondary | ICD-10-CM

## 2023-06-30 ENCOUNTER — Other Ambulatory Visit: Payer: Self-pay | Admitting: Internal Medicine

## 2023-08-01 ENCOUNTER — Other Ambulatory Visit (HOSPITAL_COMMUNITY): Payer: Self-pay | Admitting: Internal Medicine

## 2023-08-01 DIAGNOSIS — Z1231 Encounter for screening mammogram for malignant neoplasm of breast: Secondary | ICD-10-CM

## 2023-08-07 ENCOUNTER — Other Ambulatory Visit: Payer: Self-pay | Admitting: Internal Medicine

## 2023-08-07 DIAGNOSIS — I10 Essential (primary) hypertension: Secondary | ICD-10-CM

## 2023-08-28 ENCOUNTER — Other Ambulatory Visit: Payer: Self-pay | Admitting: Internal Medicine

## 2023-08-28 DIAGNOSIS — E1165 Type 2 diabetes mellitus with hyperglycemia: Secondary | ICD-10-CM

## 2023-09-04 ENCOUNTER — Encounter: Payer: Self-pay | Admitting: Internal Medicine

## 2023-09-04 ENCOUNTER — Ambulatory Visit: Payer: Medicare Other | Admitting: Internal Medicine

## 2023-09-04 VITALS — BP 128/80 | HR 74 | Ht 62.5 in | Wt 151.8 lb

## 2023-09-04 DIAGNOSIS — Z23 Encounter for immunization: Secondary | ICD-10-CM | POA: Diagnosis not present

## 2023-09-04 DIAGNOSIS — I10 Essential (primary) hypertension: Secondary | ICD-10-CM

## 2023-09-04 DIAGNOSIS — M79641 Pain in right hand: Secondary | ICD-10-CM

## 2023-09-04 DIAGNOSIS — E1169 Type 2 diabetes mellitus with other specified complication: Secondary | ICD-10-CM | POA: Diagnosis not present

## 2023-09-04 DIAGNOSIS — E785 Hyperlipidemia, unspecified: Secondary | ICD-10-CM | POA: Diagnosis not present

## 2023-09-04 DIAGNOSIS — R202 Paresthesia of skin: Secondary | ICD-10-CM

## 2023-09-04 DIAGNOSIS — Z1211 Encounter for screening for malignant neoplasm of colon: Secondary | ICD-10-CM | POA: Insufficient documentation

## 2023-09-04 DIAGNOSIS — R200 Anesthesia of skin: Secondary | ICD-10-CM

## 2023-09-04 DIAGNOSIS — E1165 Type 2 diabetes mellitus with hyperglycemia: Secondary | ICD-10-CM

## 2023-09-04 DIAGNOSIS — Z7984 Long term (current) use of oral hypoglycemic drugs: Secondary | ICD-10-CM

## 2023-09-04 NOTE — Assessment & Plan Note (Signed)
A1c 9.0 on labs from May.  She is currently prescribed metformin XR 1000 mg twice daily and Jardiance 25 mg daily.  At her last appointment in July she reported not taking her evening dose of metformin and also acknowledged that there was significant room for improvement from a dietary standpoint. -Repeat A1c and urine microalbumin/creatinine ratio ordered today. -She is interested in stopping Jardiance as she believes her A1c has improved.  Will further discuss pending A1c result.

## 2023-09-04 NOTE — Assessment & Plan Note (Signed)
Lipid panel last updated in May.  Total cholesterol 153 and LDL 86.  Rosuvastatin was increased to 20 mg daily in light of this result. -Repeat lipid panel ordered today

## 2023-09-04 NOTE — Assessment & Plan Note (Signed)
Influenza vaccine administered today.

## 2023-09-04 NOTE — Patient Instructions (Signed)
It was a pleasure to see you today.  Thank you for giving [place] the opportunity to be involved in your care.  Below is a brief recap of your visit and next steps.  We will plan to see you again in 3 months.  Summary No medication changes today Consider stopping jardiance pending A1c result Orthopedic surgery referral placed Flu shot today Follow up in 3 months

## 2023-09-04 NOTE — Assessment & Plan Note (Signed)
Cologuard ordered today °

## 2023-09-04 NOTE — Assessment & Plan Note (Signed)
Her acute concern today remains numbness/tingling in her right hand.  She fell forward earlier this year, landing on her right hand/wrist.  X-rays obtained at that time did not show any evidence of acute fracture but did reveal degenerative changes.  Positive Phalen's noted on exam today.  Will refer to orthopedic surgery for further evaluation.

## 2023-09-04 NOTE — Assessment & Plan Note (Signed)
 Remains adequately controlled on current antihypertensive regimen.  No medication changes are indicated today.

## 2023-09-04 NOTE — Progress Notes (Signed)
Established Patient Office Visit  Subjective   Patient ID: Jennifer Velasquez, female    DOB: Mar 07, 1956  Age: 67 y.o. MRN: [id number]  Chief Complaint  Patient presents with   Diabetes    Six month follow up , patient wants discontinue the Jardiance    Tingling    Tingling in right arm    [name] returns to care today for routine follow-up.  She was last evaluated by me on 7/25 for diabetes follow-up.  No medication changes were made and I recommended that she take metformin twice daily as prescribed.  There have been no acute interval events.  Today [name]'s acute concern is numbness and tingling in her right hand her radiating proximally to the upper arm.  I have previously recommended wearing a wrist splint, which she states is sometimes effective.  She is interested in additional treatment options.  Past Medical History:  Diagnosis Date   Back pain    CARPAL TUNNEL SYNDROME, LEFT 06/02/2008   Qualifier: Diagnosis of  By: Erby Pian MD, Cornelius     COVID-19 virus infection 09/29/2019   DEPRESSION, SITUATIONAL 11/25/2008   Qualifier: Diagnosis of  Problem Stop Reason:  By: Erby Pian MD, Jamelle Rushing about COVID-19 virus infection 10/21/2019   LYMPHADENOPATHY 05/28/2010   Qualifier: Diagnosis of  By: Garnette Czech PA, Dawn     Neck pain    left    Need for prophylactic hormone replacement therapy (postmenopausal)    Obesity, unspecified    Other and unspecified hyperlipidemia    Routine general medical examination at a health care facility    Trichomonal vaginitis    Type II or unspecified type diabetes mellitus without mention of complication, not stated as uncontrolled    Unspecified constipation    Unspecified essential hypertension    Past Surgical History:  Procedure Laterality Date   APPENDECTOMY     DENTAL SURGERY     TOTAL ABDOMINAL HYSTERECTOMY     fibroids   Social History   Tobacco Use   Smoking status: Former    Types: Cigarettes   Smokeless tobacco: Never   Vaping Use   Vaping status: Never Used  Substance Use Topics   Alcohol use: Yes   Drug use: No   Family History  Problem Relation Age of Onset   Pneumonia Mother    Transient ischemic attack Father    Hypertension Father    Cancer Father    Hypertension Sister    Hypertension Sister    Cancer Daughter        breast    Allergies  Allergen Reactions   Pravastatin Sodium     REACTION: sore throat   Review of Systems  Neurological:  Positive for tingling (Numbness/tingling right hand).  All other systems reviewed and are negative.    Objective:     BP 128/80 (BP Location: Left Arm, Patient Position: Sitting, Cuff Size: Normal)   Pulse 74   Ht 5' 2.5" (1.588 m)   Wt 151 lb 12.8 oz (68.9 kg)   SpO2 98%   BMI 27.32 kg/m  BP Readings from Last 3 Encounters:  09/04/23 128/80  05/08/23 105/67  03/13/23 115/75   Physical Exam Vitals reviewed.  Constitutional:      General: She is not in acute distress.    Appearance: Normal appearance. She is not toxic-appearing.  HENT:     Head: Normocephalic and atraumatic.     Right Ear: External ear normal.  Left Ear: External ear normal.     Nose: Nose normal. No congestion or rhinorrhea.     Mouth/Throat:     Mouth: Mucous membranes are moist.     Pharynx: Oropharynx is clear. No oropharyngeal exudate or posterior oropharyngeal erythema.  Eyes:     General: No scleral icterus.    Extraocular Movements: Extraocular movements intact.     Conjunctiva/sclera: Conjunctivae normal.     Pupils: Pupils are equal, round, and reactive to light.  Cardiovascular:     Rate and Rhythm: Normal rate and regular rhythm.     Pulses: Normal pulses.     Heart sounds: Normal heart sounds. No murmur heard.    No friction rub. No gallop.  Pulmonary:     Effort: Pulmonary effort is normal.     Breath sounds: Normal breath sounds. No wheezing, rhonchi or rales.  Abdominal:     General: Abdomen is flat. Bowel sounds are normal. There is no  distension.     Palpations: Abdomen is soft.     Tenderness: There is no abdominal tenderness.  Musculoskeletal:        General: No swelling. Normal range of motion.     Cervical back: Normal range of motion.     Right lower leg: No edema.     Left lower leg: No edema.  Lymphadenopathy:     Cervical: No cervical adenopathy.  Skin:    General: Skin is warm and dry.     Capillary Refill: Capillary refill takes less than 2 seconds.     Coloration: Skin is not jaundiced.  Neurological:     General: No focal deficit present.     Mental Status: She is alert and oriented to person, place, and time.     Comments: Positive Phalen's, right hand.  Psychiatric:        Mood and Affect: Mood normal.        Behavior: Behavior normal.   Last CBC Lab Results  Component Value Date   WBC 6.1 03/13/2023   HGB 14.6 03/13/2023   HCT 42.4 03/13/2023   MCV 85 03/13/2023   MCH 29.3 03/13/2023   RDW 12.5 03/13/2023   PLT 313 03/13/2023   Last metabolic panel Lab Results  Component Value Date   GLUCOSE 129 (H) 03/13/2023   NA 139 03/13/2023   K 4.4 03/13/2023   CL 100 03/13/2023   CO2 22 03/13/2023   BUN 13 03/13/2023   CREATININE 0.78 03/13/2023   EGFR 83 03/13/2023   CALCIUM 9.9 03/13/2023   PROT 7.6 03/13/2023   ALBUMIN 4.5 03/13/2023   LABGLOB 3.1 03/13/2023   AGRATIO 1.5 03/13/2023   BILITOT 0.4 03/13/2023   ALKPHOS 86 03/13/2023   AST 10 03/13/2023   ALT 11 03/13/2023   ANIONGAP 7 09/07/2020   Last lipids Lab Results  Component Value Date   CHOL 153 03/13/2023   HDL 45 03/13/2023   LDLCALC 86 03/13/2023   TRIG 126 03/13/2023   CHOLHDL 3.4 03/13/2023   Last hemoglobin A1c Lab Results  Component Value Date   HGBA1C 9.0 (H) 03/13/2023   Last thyroid functions Lab Results  Component Value Date   TSH 1.370 03/13/2023   Last vitamin D Lab Results  Component Value Date   VD25OH 33.7 03/13/2023   Last vitamin B12 and Folate Lab Results  Component Value Date    VITAMINB12 560 03/13/2023   FOLATE 16.1 03/13/2023   The 10-year ASCVD risk score (Arnett DK, et al., 2019)  is: 19.3%    Assessment & Plan:   Problem List Items Addressed This Visit       Essential hypertension    Remains adequately controlled on current antihypertensive regimen.  No medication changes are indicated today.      Type 2 diabetes mellitus with hyperglycemia (HCC)    A1c 9.0 on labs from May.  She is currently prescribed metformin XR 1000 mg twice daily and Jardiance 25 mg daily.  At her last appointment in July she reported not taking her evening dose of metformin and also acknowledged that there was significant room for improvement from a dietary standpoint. -Repeat A1c and urine microalbumin/creatinine ratio ordered today. -She is interested in stopping Jardiance as she believes her A1c has improved.  Will further discuss pending A1c result.      Hyperlipidemia associated with type 2 diabetes mellitus (HCC)    Lipid panel last updated in May.  Total cholesterol 153 and LDL 86.  Rosuvastatin was increased to 20 mg daily in light of this result. -Repeat lipid panel ordered today      Need for immunization against influenza    Influenza vaccine administered today      Right hand pain    Her acute concern today remains numbness/tingling in her right hand.  She fell forward earlier this year, landing on her right hand/wrist.  X-rays obtained at that time did not show any evidence of acute fracture but did reveal degenerative changes.  Positive Phalen's noted on exam today.  Will refer to orthopedic surgery for further evaluation.      Colon cancer screening - Primary    Cologuard ordered today      Return in about 3 months (around 12/05/2023).   Billie Lade, MD

## 2023-09-07 LAB — MICROALBUMIN / CREATININE URINE RATIO
Creatinine, Urine: 66.1 mg/dL
Microalb/Creat Ratio: 5 mg/g{creat} (ref 0–29)
Microalbumin, Urine: 3.4 ug/mL

## 2023-09-07 LAB — LIPID PANEL
Chol/HDL Ratio: 2.9 ratio (ref 0.0–4.4)
Cholesterol, Total: 117 mg/dL (ref 100–199)
HDL: 41 mg/dL (ref >39)
LDL Chol Calc (NIH): 61 mg/dL (ref 0–99)
Triglycerides: 71 mg/dL (ref 0–149)
VLDL Cholesterol Cal: 15 mg/dL (ref 5–40)

## 2023-09-07 LAB — HEMOGLOBIN A1C
Est. average glucose Bld gHb Est-mCnc: 189 mg/dL
Hgb A1c MFr Bld: 8.2 % — ABNORMAL HIGH (ref 4.8–5.6)

## 2023-09-18 ENCOUNTER — Ambulatory Visit (HOSPITAL_COMMUNITY)
Admission: RE | Admit: 2023-09-18 | Discharge: 2023-09-18 | Disposition: A | Discharge status: Home / Self Care | Payer: Medicare Other | Source: Ambulatory Visit | Attending: Internal Medicine | Admitting: Internal Medicine

## 2023-09-18 DIAGNOSIS — Z1231 Encounter for screening mammogram for malignant neoplasm of breast: Secondary | ICD-10-CM | POA: Insufficient documentation

## 2023-09-18 DIAGNOSIS — Z1211 Encounter for screening for malignant neoplasm of colon: Secondary | ICD-10-CM | POA: Diagnosis not present

## 2023-09-23 ENCOUNTER — Encounter: Payer: Self-pay | Admitting: Orthopedic Surgery

## 2023-09-23 ENCOUNTER — Ambulatory Visit: Payer: Medicare Other | Admitting: Orthopedic Surgery

## 2023-09-23 ENCOUNTER — Other Ambulatory Visit (INDEPENDENT_AMBULATORY_CARE_PROVIDER_SITE_OTHER): Payer: Self-pay

## 2023-09-23 VITALS — BP 117/80 | HR 85 | Ht 62.5 in | Wt 151.0 lb

## 2023-09-23 DIAGNOSIS — M792 Neuralgia and neuritis, unspecified: Secondary | ICD-10-CM | POA: Diagnosis not present

## 2023-09-23 MED ORDER — CYCLOBENZAPRINE HCL 10 MG PO TABS
10.0000 mg | ORAL_TABLET | Freq: Two times a day (BID) | ORAL | 0 refills | Status: DC | PRN
Start: 2023-09-23 — End: 2024-08-06

## 2023-09-23 MED ORDER — PREDNISONE 10 MG (21) PO TBPK: ORAL_TABLET | ORAL | 0 refills | Status: DC

## 2023-09-23 NOTE — Progress Notes (Signed)
New Patient Visit  Assessment: Jennifer Velasquez is a 67 y.o. female with the following: 1. Radicular pain in right arm  Plan: Jennifer Velasquez has pain, numbness and tingling radiating into the right arm.  This is been ongoing for several months.  Radiographs demonstrate degenerative changes, without anterolisthesis.  She has negative Tinel's at the carpal tunnel.  Based on the description of her symptoms, I am concerned about compression of the nerves exiting the cervical spine.  As such, I am recommending a short course of prednisone, as well as some Flexeril.  In addition, I have provided her with some home exercises.  If she continues to have issues, I would recommend formal physical therapy.  If she notes worsening pain, numbness, dexterity in her hand or issues with her balance, I would recommend she return to clinic.  Follow-up: Return if symptoms worsen or fail to improve.  Subjective:  Chief Complaint  Patient presents with   Arm Pain    R arm numbness and tingling for 4-5 mos. States it's getting worse.     History of Present Illness: Jennifer Velasquez is a 67 y.o. female who has been referred by Delmar Landau, MD for evaluation of right arm pain.  She is right-hand dominant.  She states she has had symptoms of radiating pains into the right arm for the past 4 to 5 months.  No specific injury.  Currently, she is having some numbness and tingling throughout the right hand.  No prior injuries.  NSAIDs have not been effective.  She has not worked with physical therapy.   Review of Systems: No fevers or chills + numbness or tingling No chest pain No shortness of breath No bowel or bladder dysfunction No GI distress No headaches   Medical History:  Past Medical History:  Diagnosis Date   Back pain    CARPAL TUNNEL SYNDROME, LEFT 06/02/2008   Qualifier: Diagnosis of  By: Erby Pian MD, Cornelius     COVID-19 virus infection 09/29/2019   DEPRESSION, SITUATIONAL 11/25/2008   Qualifier:  Diagnosis of  Problem Stop Reason:  By: Erby Pian MD, Jamelle Rushing about COVID-19 virus infection 10/21/2019   LYMPHADENOPATHY 05/28/2010   Qualifier: Diagnosis of  By: Garnette Czech PA, Dawn     Neck pain    left    Need for prophylactic hormone replacement therapy (postmenopausal)    Obesity, unspecified    Other and unspecified hyperlipidemia    Routine general medical examination at a health care facility    Trichomonal vaginitis    Type II or unspecified type diabetes mellitus without mention of complication, not stated as uncontrolled    Unspecified constipation    Unspecified essential hypertension     Past Surgical History:  Procedure Laterality Date   APPENDECTOMY     DENTAL SURGERY     TOTAL ABDOMINAL HYSTERECTOMY     fibroids    Family History  Problem Relation Age of Onset   Pneumonia Mother    Transient ischemic attack Father    Hypertension Father    Cancer Father    Hypertension Sister    Hypertension Sister    Cancer Daughter        breast    Social History   Tobacco Use   Smoking status: Former    Types: Cigarettes   Smokeless tobacco: Never  Vaping Use   Vaping status: Never Used  Substance Use Topics   Alcohol use: Yes   Drug use: No  Allergies  Allergen Reactions   Pravastatin Sodium     REACTION: sore throat    Current Meds  Medication Sig   cholecalciferol (VITAMIN D3) 25 MCG (1000 UNIT) tablet TAKE ONE TABLET BY MOUTH ONCE DAILY   cyclobenzaprine (FLEXERIL) 10 MG tablet Take 1 tablet (10 mg total) by mouth 2 (two) times daily as needed.   diphenhydrAMINE (BENADRYL) 25 MG tablet Take 25 mg by mouth every 6 (six) hours as needed for itching.   JARDIANCE 25 MG TABS tablet TAKE ONE TABLET BY MOUTH EVERY MORNING BEFORE BREAKFAST   lisinopril (ZESTRIL) 20 MG tablet TAKE ONE TABLET BY MOUTH ONCE DAILY   metFORMIN (GLUCOPHAGE-XR) 500 MG 24 hr tablet TAKE 4 TABLETS BY MOUTH EVERY DAY WITH WITH BREAKFAST   Multiple Vitamin (MULTIVITAMIN  WITH MINERALS) TABS tablet Take 1 tablet by mouth daily.   predniSONE (STERAPRED UNI-PAK 21 TAB) 10 MG (21) TBPK tablet 10 mg DS 12 as directed   rosuvastatin (CRESTOR) 20 MG tablet Take 1 tablet (20 mg total) by mouth daily.   triamcinolone cream (KENALOG) 0.1 % Apply 1 Application topically 2 (two) times daily.   TRUE METRIX BLOOD GLUCOSE TEST test strip    TRUEplus Lancets 30G MISC     Objective: BP 117/80   Pulse 85   Ht 5' 2.5" (1.588 m)   Wt 151 lb (68.5 kg)   BMI 27.18 kg/m   Physical Exam:  General: Alert and oriented. and No acute distress. Gait: Normal gait.  Slightly restricted range of motion of the neck.  She has good range of motion of the right shoulder.  Decree sensation throughout the right hand.  Negative Tinel's at the carpal tunnel.  She has good upper body strength bilaterally.  Negative Hoffmann's.  IMAGING: I personally ordered and reviewed the following images  X-ray of the cervical spine were obtained in clinic today.  No acute injuries are noted.  Well-maintained overall alignment.  No evidence of anterolisthesis.  Mild loss of disc height.  Small anterior based osteophytes.  No bony lesions.  Impression: Cervical spine x-rays with mild degenerative changes overall    New Medications:  Meds ordered this encounter  Medications   predniSONE (STERAPRED UNI-PAK 21 TAB) 10 MG (21) TBPK tablet    Sig: 10 mg DS 12 as directed    Dispense:  48 tablet    Refill:  0   cyclobenzaprine (FLEXERIL) 10 MG tablet    Sig: Take 1 tablet (10 mg total) by mouth 2 (two) times daily as needed.    Dispense:  20 tablet    Refill:  0      Oliver Barre, MD  09/23/2023 1:19 PM

## 2023-09-23 NOTE — Patient Instructions (Signed)
 Cervical Strain and Sprain Rehab You have pain and stiffness in your neck.  The muscles around your neck are irritated.  Recommend using heat (heating pad, or hot water in the shower) to warm up the affected muscles.  Then proceed with stretching and strengthening.  Do not stretch until it hurts, but you should feel a pull.  With each exercise, you should be able to stretch a little bit further.  Attempting these exercises daily, or on a regular basis, can improve your symptoms over time.  Do not expect immediate, sustained improvement.  But, it will make your symptoms better over time.   Ask your health care provider which exercises are safe for you. Do exercises exactly as told by your health care provider and adjust them as directed. It is normal to feel mild stretching, pulling, tightness, or discomfort as you do these exercises. Stop right away if you feel sudden pain or your pain gets worse. Do not begin these exercises until told by your health care provider. Stretching and range-of-motion exercises Cervical side bending  Using good posture, sit on a stable chair or stand up. Without moving your shoulders, slowly tilt your left / right ear to your shoulder until you feel a stretch in the opposite side neck muscles. You should be looking straight ahead. Hold for 10 seconds. Repeat with the other side of your neck. Repeat 10 times. Complete this exercise 1-2 times a day. Cervical rotation  Using good posture, sit on a stable chair or stand up. Slowly turn your head to the side as if you are looking over your left / right shoulder. Keep your eyes level with the ground. Stop when you feel a stretch along the side and the back of your neck. Hold for 10 seconds. Repeat this by turning to your other side. Repeat 10 times. Complete this exercise 1-2 times a day. Thoracic extension and pectoral stretch Roll a towel or a small blanket so it is about 4 inches (10 cm) in diameter. Lie down on  your back on a firm surface. Put the towel lengthwise, under your spine in the middle of your back. It should not be under your shoulder blades. The towel should line up with your spine from your middle back to your lower back. Put your hands behind your head and let your elbows fall out to your sides. Hold for 10 seconds. Repeat 10 times. Complete this exercise 1-2 times a day. Strengthening exercises Isometric upper cervical flexion Lie on your back with a thin pillow behind your head and a small rolled-up towel under your neck. Gently tuck your chin toward your chest and nod your head down to look toward your feet. Do not lift your head off the pillow. Hold for 10 seconds. Release the tension slowly. Relax your neck muscles completely before you repeat this exercise. Repeat 10 times. Complete this exercise 1-2 times a day. Isometric cervical extension  Stand about 6 inches (15 cm) away from a wall, with your back facing the wall. Place a soft object, about 6-8 inches (15-20 cm) in diameter, between the back of your head and the wall. A soft object could be a small pillow, a ball, or a folded towel. Gently tilt your head back and press into the soft object. Keep your jaw and forehead relaxed. Hold for 10 seconds. Release the tension slowly. Relax your neck muscles completely before you repeat this exercise. Repeat 10 times. Complete this exercise 1-2 times a day. Posture   and body mechanics Body mechanics refers to the movements and positions of your body while you do your daily activities. Posture is part of body mechanics. Good posture and healthy body mechanics can help to relieve stress in your body's tissues and joints. Good posture means that your spine is in its natural S-curve position (your spine is neutral), your shoulders are pulled back slightly, and your head is not tipped forward. The following are general guidelines for applying improved posture and body mechanics to your  everyday activities. Sitting  When sitting, keep your spine neutral and keep your feet flat on the floor. Use a footrest, if necessary, and keep your thighs parallel to the floor. Avoid rounding your shoulders, and avoid tilting your head forward. When working at a desk or a computer, keep your desk at a height where your hands are slightly lower than your elbows. Slide your chair under your desk so you are close enough to maintain good posture. When working at a computer, place your monitor at a height where you are looking straight ahead and you do not have to tilt your head forward or downward to look at the screen. Standing  When standing, keep your spine neutral and keep your feet about hip-width apart. Keep a slight bend in your knees. Your ears, shoulders, and hips should line up. When you do a task in which you stand in one place for a long time, place one foot up on a stable object that is 2-4 inches (5-10 cm) high, such as a footstool. This helps keep your spine neutral. Resting When lying down and resting, avoid positions that are most painful for you. Try to support your neck in a neutral position. You can use a contour pillow or a small rolled-up towel. Your pillow should support your neck but not push on it. This information is not intended to replace advice given to you by your health care provider. Make sure you discuss any questions you have with your health care provider. Document Revised: 01/20/2019 Document Reviewed: 07/01/2018 Elsevier Patient Education  2022 [employer]  

## 2023-09-25 LAB — COLOGUARD: COLOGUARD: NEGATIVE

## 2023-10-23 DIAGNOSIS — M79604 Pain in right leg: Secondary | ICD-10-CM | POA: Diagnosis not present

## 2023-10-23 DIAGNOSIS — I83893 Varicose veins of bilateral lower extremities with other complications: Secondary | ICD-10-CM | POA: Diagnosis not present

## 2023-10-23 DIAGNOSIS — M79662 Pain in left lower leg: Secondary | ICD-10-CM | POA: Diagnosis not present

## 2023-10-23 DIAGNOSIS — M79605 Pain in left leg: Secondary | ICD-10-CM | POA: Diagnosis not present

## 2023-10-23 DIAGNOSIS — M79661 Pain in right lower leg: Secondary | ICD-10-CM | POA: Diagnosis not present

## 2023-10-29 IMAGING — MG MM DIGITAL SCREENING BILAT W/ TOMO AND CAD
6 of 12 series · 6 of 36 positions shown · non-contrast
Comparison: Previous exam(s).

CLINICAL DATA: Screening.

EXAM:
DIGITAL SCREENING BILATERAL MAMMOGRAM WITH TOMOSYNTHESIS AND CAD
TECHNIQUE: Bilateral screening digital craniocaudal and mediolateral oblique
mammograms were obtained. Bilateral screening digital breast
tomosynthesis was performed. The images were evaluated with
computer-aided detection.

[L CC synth-2D (1 of 2)]
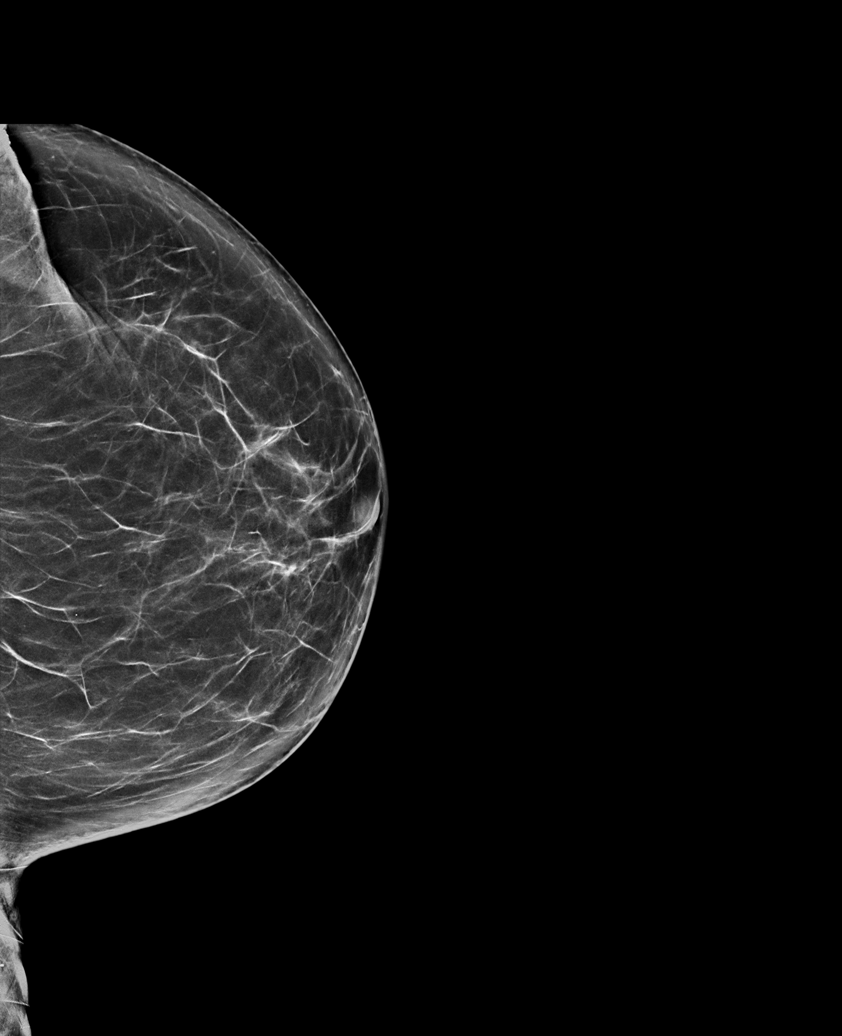

[R XCCL synth-2D]
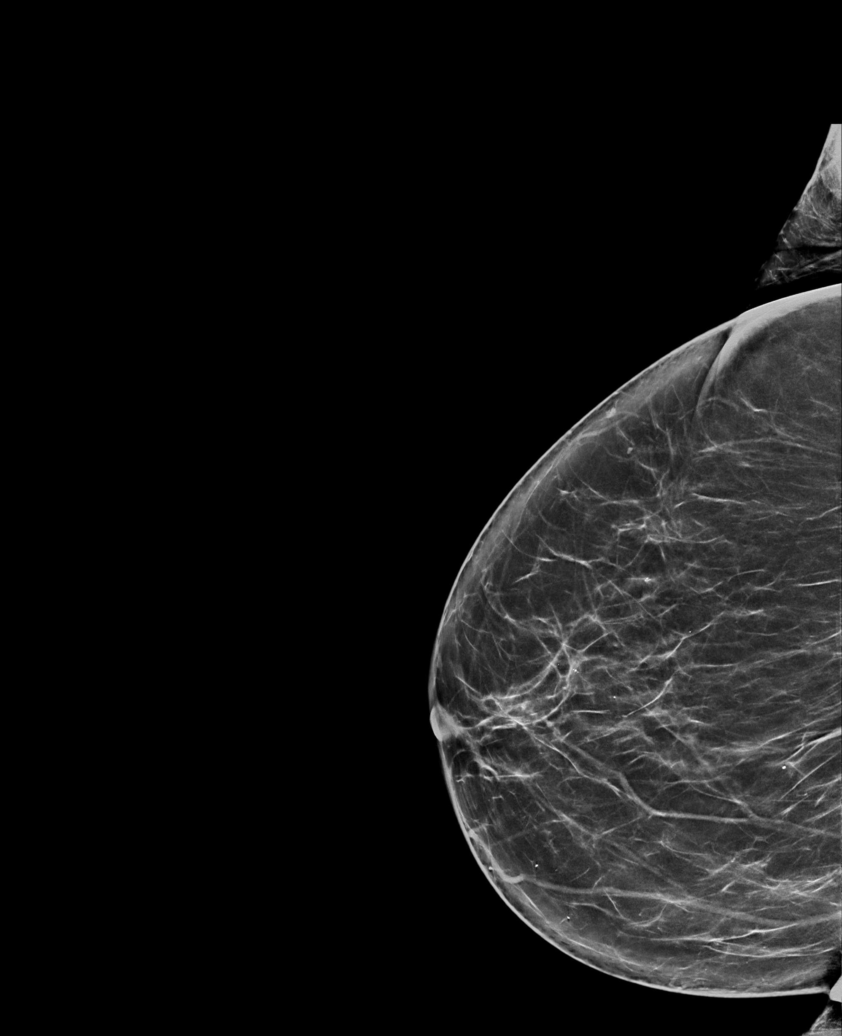

[L CC synth-2D (2 of 2)]
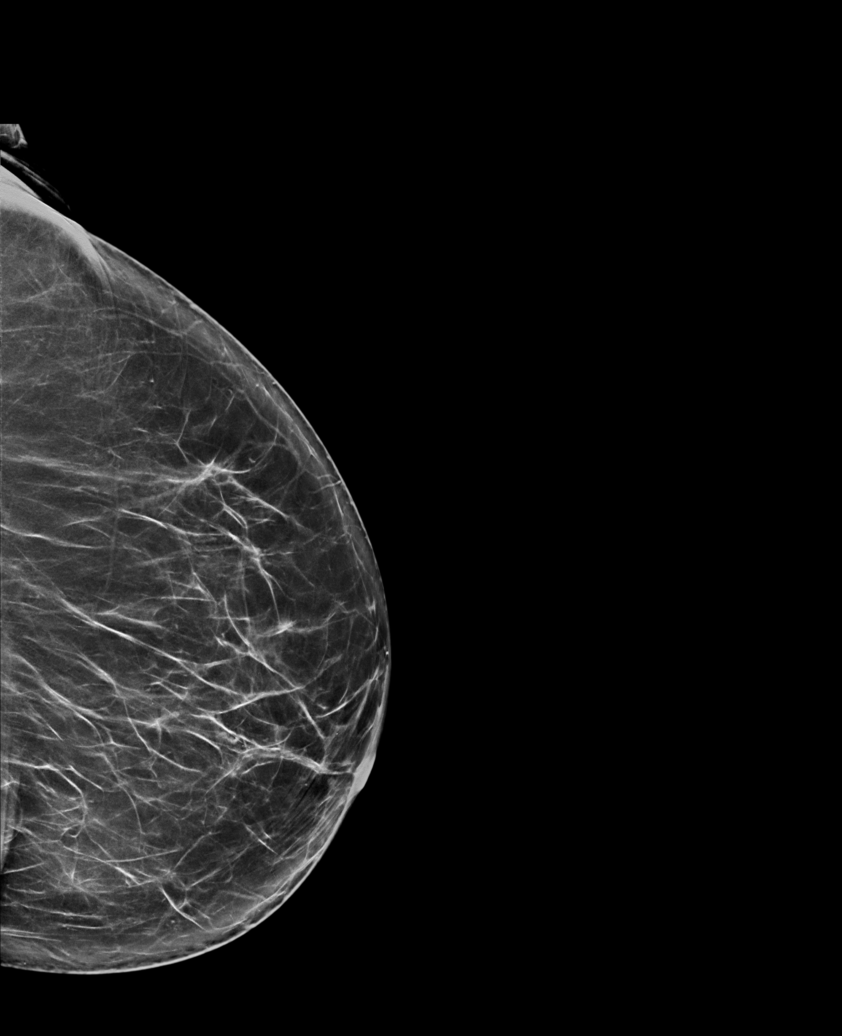

[L MLO synth-2D]
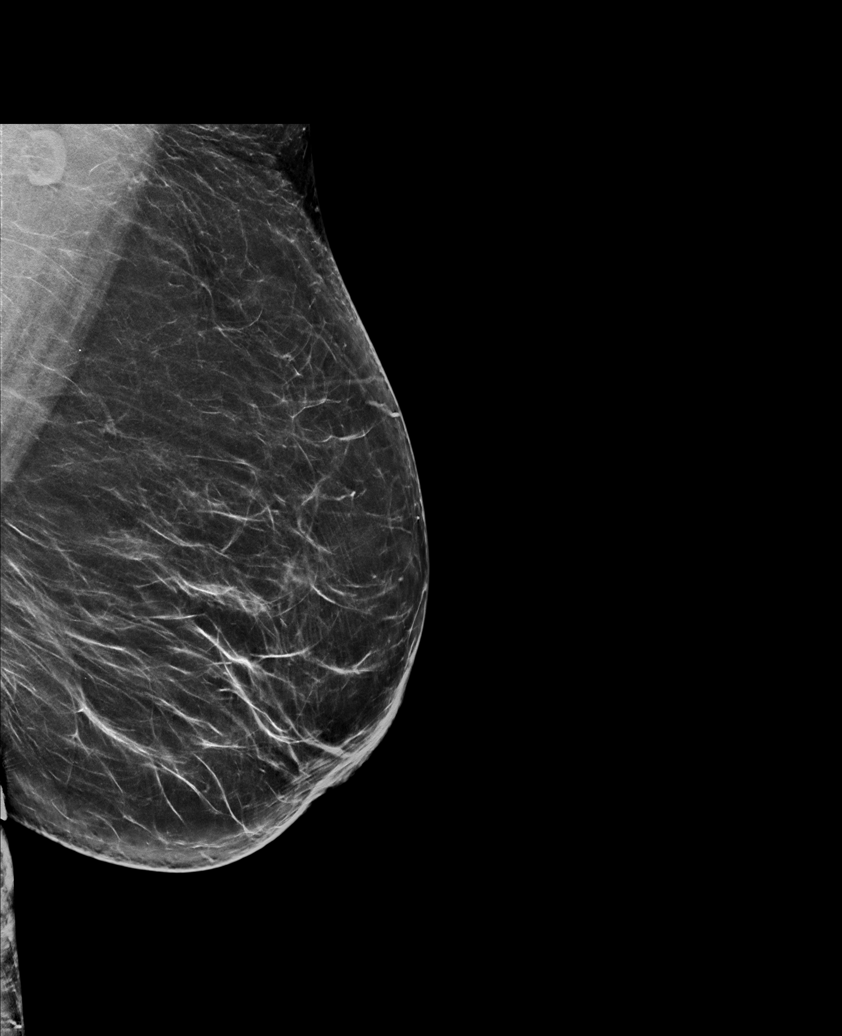

[R CC synth-2D]
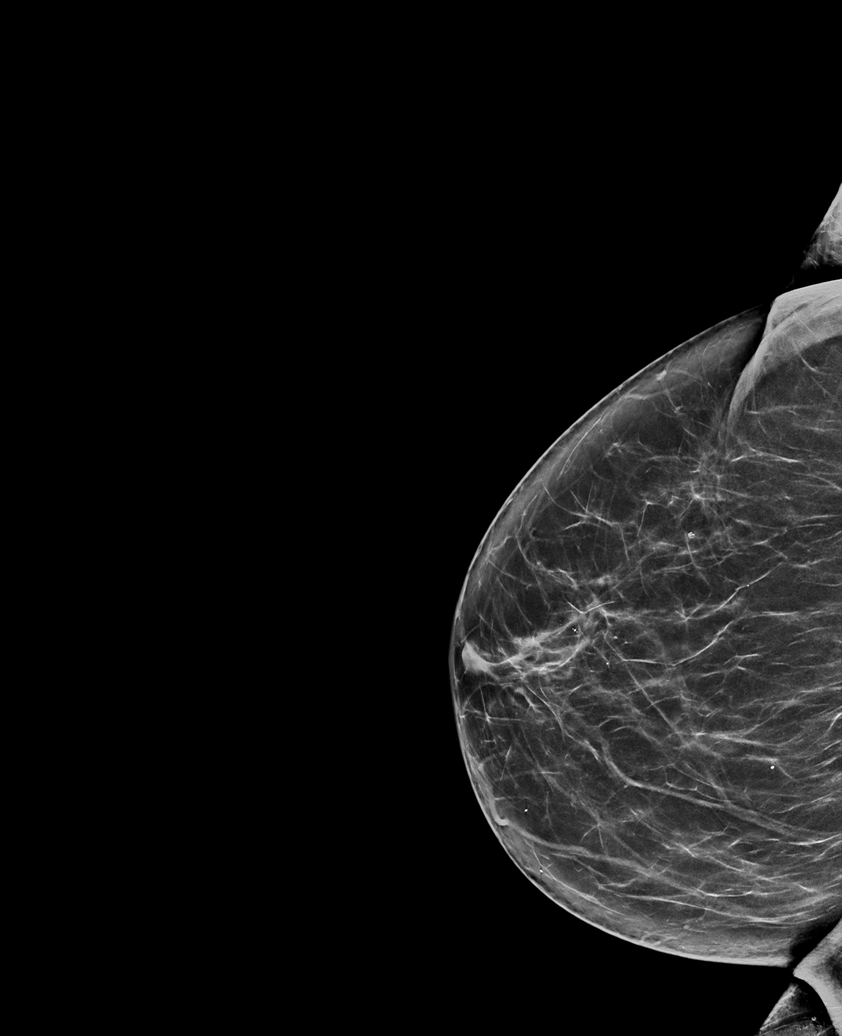

[R MLO synth-2D]
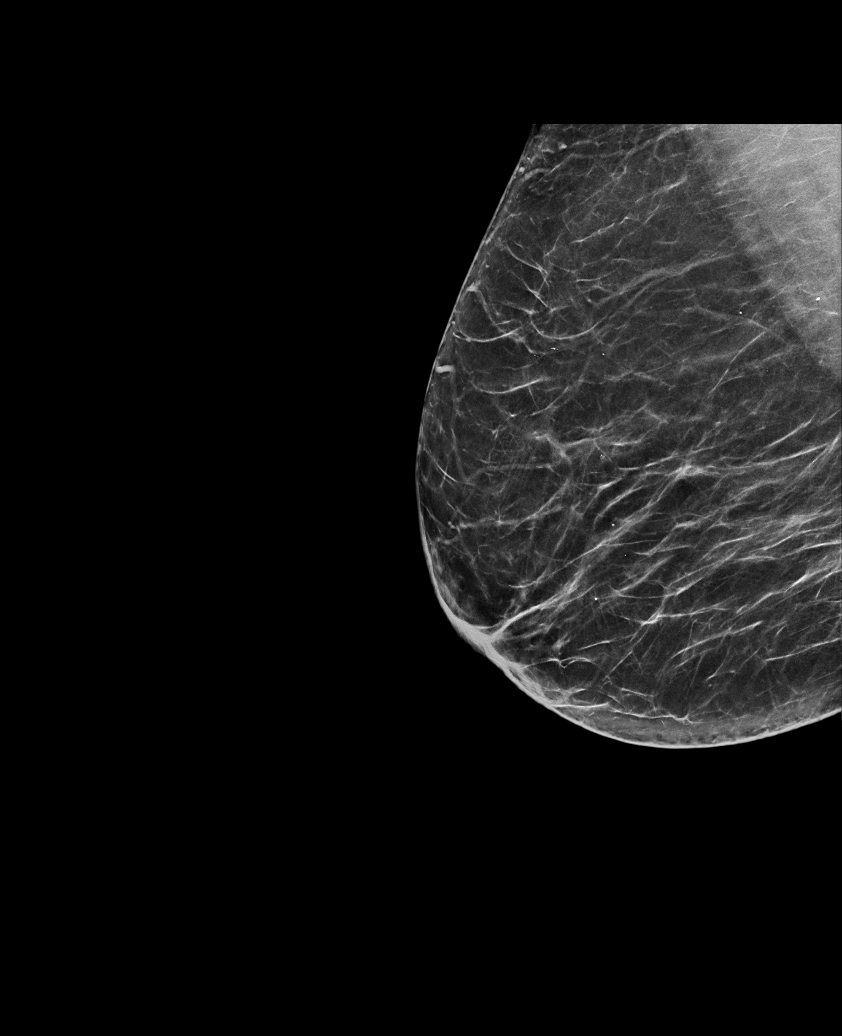

[6 of 36 positions shown; findings below may reference images not displayed]

ACR Breast Density Category c: The breast tissue is heterogeneously
dense, which may obscure small masses.
FINDINGS: There are no findings suspicious for malignancy.
IMPRESSION: No mammographic evidence of malignancy. A result letter of this
screening mammogram will be mailed directly to the patient.

RECOMMENDATION:
Screening mammogram in one year. (Code:Q3-W-BC3)

BI-RADS CATEGORY  1: Negative.

## 2023-11-23 ENCOUNTER — Other Ambulatory Visit: Payer: Self-pay | Admitting: Internal Medicine

## 2023-11-23 DIAGNOSIS — E1165 Type 2 diabetes mellitus with hyperglycemia: Secondary | ICD-10-CM

## 2023-12-10 ENCOUNTER — Telehealth: Payer: Self-pay

## 2023-12-10 NOTE — Telephone Encounter (Signed)
 Patient was identified as falling into the [place] Measure - Diabetes.   Patient was: Left voicemail to schedule with primary care provider.   Please contact patient - requires A1C follow up with PCP with labs.

## 2023-12-10 NOTE — Telephone Encounter (Signed)
 Appt scheduled, patient aware.

## 2023-12-11 ENCOUNTER — Ambulatory Visit: Payer: Medicare Other | Admitting: Internal Medicine

## 2023-12-17 ENCOUNTER — Ambulatory Visit (INDEPENDENT_AMBULATORY_CARE_PROVIDER_SITE_OTHER): Payer: Medicare Other

## 2023-12-17 VITALS — Ht 62.5 in | Wt 154.0 lb

## 2023-12-17 DIAGNOSIS — Z780 Asymptomatic menopausal state: Secondary | ICD-10-CM | POA: Diagnosis not present

## 2023-12-17 DIAGNOSIS — Z0000 Encounter for general adult medical examination without abnormal findings: Secondary | ICD-10-CM | POA: Diagnosis not present

## 2023-12-17 NOTE — Progress Notes (Signed)
 Please attest and cosign this visit due to patients primary care provider not being in the office at the time the visit was completed.  Because this visit was a virtual/telehealth visit,  certain criteria was not obtained, such a blood pressure, CBG if applicable, and timed get up and go. Any medications not marked as "taking" were not mentioned during the medication reconciliation part of the visit. Any vitals not documented were not able to be obtained due to this being a telehealth visit or patient was unable to self-report a recent blood pressure reading due to a lack of equipment at home via telehealth. Vitals that have been documented are verbally provided by the patient.   Subjective:   Jennifer Velasquez is a 68 y.o. who presents for a Medicare Wellness preventive visit.  Visit Complete: Virtual I connected with  Jennifer Velasquez on 12/17/23 by a audio enabled telemedicine application and verified that I am speaking with the correct person using two identifiers.  Patient Location: Home  Provider Location: Home Office  I discussed the limitations of evaluation and management by telemedicine. The patient expressed understanding and agreed to proceed.  Vital Signs: Because this visit was a virtual/telehealth visit, some criteria may be missing or patient reported. Any vitals not documented were not able to be obtained and vitals that have been documented are patient reported.  VideoDeclined- This patient declined Librarian, academic. Therefore the visit was completed with audio only.  AWV Questionnaire: No: Patient Medicare AWV questionnaire was not completed prior to this visit.  Cardiac Risk Factors include: advanced age (>35men, >74 women);diabetes mellitus;dyslipidemia;hypertension;sedentary lifestyle     Objective:    Today's Vitals   12/17/23 1010  Weight: 154 lb (69.9 kg)  Height: 5' 2.5" (1.588 m)   Body mass index is 27.72 kg/m.     12/17/2023   10:26  AM 09/07/2020    4:20 PM 09/30/2019    5:57 PM 01/29/2016    2:33 PM  Advanced Directives  Does Patient Have a Medical Advance Directive? No No No No  Would patient like information on creating a medical advance directive? No - Patient declined No - Patient declined No - Guardian declined     Current Medications (verified) Outpatient Encounter Medications as of 12/17/2023  Medication Sig   cholecalciferol (VITAMIN D3) 25 MCG (1000 UNIT) tablet TAKE ONE TABLET BY MOUTH ONCE DAILY   cyclobenzaprine (FLEXERIL) 10 MG tablet Take 1 tablet (10 mg total) by mouth 2 (two) times daily as needed.   diphenhydrAMINE (BENADRYL) 25 MG tablet Take 25 mg by mouth every 6 (six) hours as needed for itching.   JARDIANCE 25 MG TABS tablet TAKE ONE TABLET BY MOUTH EVERY MORNING BEFORE BREAKFAST   lisinopril (ZESTRIL) 20 MG tablet TAKE ONE TABLET BY MOUTH ONCE DAILY   metFORMIN (GLUCOPHAGE-XR) 500 MG 24 hr tablet TAKE 4 TABLETS BY MOUTH EVERY DAY WITH WITH BREAKFAST   Multiple Vitamin (MULTIVITAMIN WITH MINERALS) TABS tablet Take 1 tablet by mouth daily.   predniSONE (STERAPRED UNI-PAK 21 TAB) 10 MG (21) TBPK tablet 10 mg DS 12 as directed   rosuvastatin (CRESTOR) 20 MG tablet Take 1 tablet (20 mg total) by mouth daily.   triamcinolone cream (KENALOG) 0.1 % Apply 1 Application topically 2 (two) times daily.   TRUE METRIX BLOOD GLUCOSE TEST test strip    TRUEplus Lancets 30G MISC    No facility-administered encounter medications on file as of 12/17/2023.    Allergies (verified) Pravastatin  sodium   History: Past Medical History:  Diagnosis Date   Back pain    CARPAL TUNNEL SYNDROME, LEFT 06/02/2008   Qualifier: Diagnosis of  By: Erby Pian MD, Cornelius     COVID-19 virus infection 09/29/2019   DEPRESSION, SITUATIONAL 11/25/2008   Qualifier: Diagnosis of  Problem Stop Reason:  By: Erby Pian MD, Jamelle Rushing about COVID-19 virus infection 10/21/2019   LYMPHADENOPATHY 05/28/2010   Qualifier: Diagnosis  of  By: Garnette Czech PA, Dawn     Neck pain    left    Need for prophylactic hormone replacement therapy (postmenopausal)    Obesity, unspecified    Other and unspecified hyperlipidemia    Routine general medical examination at a health care facility    Trichomonal vaginitis    Type II or unspecified type diabetes mellitus without mention of complication, not stated as uncontrolled    Unspecified constipation    Unspecified essential hypertension    Past Surgical History:  Procedure Laterality Date   APPENDECTOMY     DENTAL SURGERY     TOTAL ABDOMINAL HYSTERECTOMY     fibroids   Family History  Problem Relation Age of Onset   Pneumonia Mother    Transient ischemic attack Father    Hypertension Father    Cancer Father    Hypertension Sister    Hypertension Sister    Cancer Daughter        breast    Social History   Socioeconomic History   Marital status: Single    Spouse name: Not on file   Number of children: 2   Years of education: 12th   Highest education level: Not on file  Occupational History   Occupation: Production designer, theatre/television/film of group [employer]   Tobacco Use   Smoking status: Former    Types: Cigarettes   Smokeless tobacco: Never  Vaping Use   Vaping status: Never Used  Substance and Sexual Activity   Alcohol use: Yes   Drug use: No   Sexual activity: Not Currently  Other Topics Concern   Not on file  Social History Narrative   Lives with daughter Randa Evens) and granddaughter Lequita Halt)   Son (antwan- lives in [place]) 2 grand babies (boys)      Enjoys spends time with Lequita Halt, reads books, spends time with friend      Diet: bread, pasta, veggies, bacon    Caffeine: frozen coffee, soda -daily   Water: 3 bottles of water      Wears seat belt   Doesn't not wear sunscreen   Smoke detectors   Report not using phone while driving    Social Drivers of Health   Financial Resource Strain: Low Risk  (12/17/2023)   Overall Financial Resource Strain (CARDIA)    Difficulty of Paying  Living Expenses: Not hard at all  Food Insecurity: No Food Insecurity (12/17/2023)   Hunger Vital Sign    Worried About Running Out of Food in the Last Year: Never true    Ran Out of Food in the Last Year: Never true  Transportation Needs: No Transportation Needs (12/17/2023)   PRAPARE - Administrator, Civil Service (Medical): No    Lack of Transportation (Non-Medical): No  Physical Activity: Insufficiently Active (12/17/2023)   Exercise Vital Sign    Days of Exercise per Week: 3 days    Minutes of Exercise per Session: 20 min  Stress: No Stress Concern Present (12/17/2023)   [employer] of Occupational Health - Occupational Stress  Questionnaire    Feeling of Stress : Not at all  Social Connections: Moderately Isolated (12/17/2023)   Social Connection and Isolation Panel [NHANES]    Frequency of Communication with Friends and Family: More than three times a week    Frequency of Social Gatherings with Friends and Family: Three times a week    Attends Religious Services: More than 4 times per year    Active Member of Clubs or Organizations: No    Attends Banker Meetings: Never    Marital Status: Never married    Tobacco Counseling Counseling given: Yes    Clinical Intake:  Pre-visit preparation completed: Yes  Pain : No/denies pain     BMI - recorded: 27.72 Nutritional Risks: None Diabetes: Yes CBG done?: No (telehealth visit. unable to obtain bp) Did pt. bring in CBG monitor from home?: No  How often do you need to have someone help you when you read instructions, pamphlets, or other written materials from your doctor or pharmacy?: 1 - Never  Interpreter Needed?: No  Information entered by :: A Avaeh Ewer, CMA   Activities of Daily Living     12/17/2023   10:26 AM  In your present state of health, do you have any difficulty performing the following activities:  Hearing? 0  Vision? 0  Difficulty concentrating or making decisions? 0  Walking  or climbing stairs? 0  Dressing or bathing? 0  Doing errands, shopping? 0  Preparing Food and eating ? N  Using the Toilet? N  In the past six months, have you accidently leaked urine? N  Do you have problems with loss of bowel control? N  Managing your Medications? N  Managing your Finances? N  Housekeeping or managing your Housekeeping? N    Patient Care Team: Billie Lade, MD as PCP - General (Internal Medicine) Gavin Pound, [facility] (Inactive) (Pharmacist) Oliver Barre, MD as Consulting Physician (Orthopedic Surgery)  Indicate any recent Medical Services you may have received from other than [facility] providers in the past year (date may be approximate).     Assessment:   This is a routine wellness examination for [facility].  Hearing/Vision screen Hearing Screening - Comments:: Patient denies any hearing difficulties.   Vision Screening - Comments:: Patient wears reading glasses only. Up to date with yearly exams.  Sees [facility] in [place]   Goals Addressed             This Visit's Progress    Patient Stated       Working towards a day shift position so that I can be able to do more things I want to do.        Depression Screen     12/17/2023   10:27 AM 09/04/2023    9:40 AM 05/08/2023    9:26 AM 03/13/2023    9:51 AM 11/07/2022    4:30 PM 08/29/2022   10:21 AM 08/16/2022   10:58 AM  PHQ 2/9 Scores  PHQ - 2 Score 0 0 0 0 0 0 0  PHQ- 9 Score 0 0 0  0      Fall Risk     12/17/2023   10:24 AM 09/04/2023    9:39 AM 05/08/2023    9:26 AM 03/13/2023    9:51 AM 11/07/2022    4:30 PM  Fall Risk   Falls in the past year? 0 0 0 1 0  Number falls in past yr: 0 0 0 0 0  Injury  with Fall? 0 0 0 0 0  Risk for fall due to : No Fall Risks No Fall Risks No Fall Risks  No Fall Risks  Follow up Falls prevention discussed;Education provided Falls evaluation completed Falls evaluation completed  Falls evaluation completed    MEDICARE RISK AT HOME:  Medicare Risk  at Home Any stairs in or around the home?: Yes If so, are there any without handrails?: No Home free of loose throw rugs in walkways, pet beds, electrical cords, etc?: Yes Adequate lighting in your home to reduce risk of falls?: Yes Life alert?: No Use of a cane, walker or w/c?: No Grab bars in the bathroom?: No Shower chair or bench in shower?: No Elevated toilet seat or a handicapped toilet?: Yes  TIMED UP AND GO:  Was the test performed?  No  Cognitive Function: 6CIT completed        12/17/2023   10:22 AM  6CIT Screen  What Year? 0 points  What month? 0 points  What time? 0 points  Count back from 20 0 points  Months in reverse 0 points  Repeat phrase 0 points  Total Score 0 points    Immunizations Immunization History  Administered Date(s) Administered   Fluad Quad(high Dose 65+) 07/20/2020, 09/14/2021, 07/29/2022   Fluad Trivalent(High Dose 65+) 09/04/2023   H1N1 09/12/2008   Influenza Whole 07/20/2007, 06/30/2008   Influenza,inj,Quad PF,6+ Mos 07/22/2018, 06/28/2019   Moderna SARS-COV2 Booster Vaccination 10/21/2019, 11/18/2019, 08/14/2020, 04/11/2021   PNEUMOCOCCAL CONJUGATE-20 08/09/2022   PPD Test 08/25/2015   Pneumococcal Conjugate-13 03/29/2021   Pneumococcal Polysaccharide-23 04/23/2007   Tdap 02/07/2018   Zoster Recombinant(Shingrix) 12/13/2021, 08/29/2022    Screening Tests Health Maintenance  Topic Date Due   DEXA SCAN  01/05/2023   COVID-19 Vaccine (1 - 2024-25 season) 06/15/2023   FOOT EXAM  11/08/2023   HEMOGLOBIN A1C  03/03/2024   Diabetic kidney evaluation - eGFR measurement  03/12/2024   OPHTHALMOLOGY EXAM  04/30/2024   Diabetic kidney evaluation - Urine ACR  09/03/2024   MAMMOGRAM  09/17/2024   Medicare Annual Wellness (AWV)  12/16/2024   Fecal DNA (Cologuard)  09/17/2026   DTaP/Tdap/Td (2 - Td or Tdap) 02/08/2028   Pneumonia Vaccine 46+ Years old  Completed   INFLUENZA VACCINE  Completed   Hepatitis C Screening  Completed   Zoster  Vaccines- Shingrix  Completed   HPV VACCINES  Aged Out   Colonoscopy  Discontinued    Health Maintenance  Health Maintenance Due  Topic Date Due   DEXA SCAN  01/05/2023   COVID-19 Vaccine (1 - 2024-25 season) 06/15/2023   FOOT EXAM  11/08/2023   Health Maintenance Items Addressed: DEXA ordered  Additional Screening:  Vision Screening: Recommended annual ophthalmology exams for early detection of glaucoma and other disorders of the eye.  Dental Screening: Recommended annual dental exams for proper oral hygiene  Community Resource Referral / Chronic Care Management: CRR required this visit?  No   CCM required this visit?  No     Plan:     I have personally reviewed and noted the following in the patient's chart:   Medical and social history Use of alcohol, tobacco or illicit drugs  Current medications and supplements including opioid prescriptions. Patient is not currently taking opioid prescriptions. Functional ability and status Nutritional status Physical activity Advanced directives List of other physicians Hospitalizations, surgeries, and ER visits in previous 12 months Vitals Screenings to include cognitive, depression, and falls Referrals and appointments  In addition,  I have reviewed and discussed with patient certain preventive protocols, quality metrics, and best practice recommendations. A written personalized care plan for preventive services as well as general preventive health recommendations were provided to patient.     Jordan Hawks Cheryel Kyte, CMA   12/17/2023   After Visit Summary: (Mail) Due to this being a telephonic visit, the after visit summary with patients personalized plan was offered to patient via mail   Notes: Nothing significant to report at this time.

## 2023-12-17 NOTE — Patient Instructions (Signed)
 Jennifer Velasquez , Thank you for taking time to come for your Medicare Wellness Visit. I appreciate your ongoing commitment to your health goals. Please review the following plan we discussed and let me know if I can assist you in the future.   Referrals/Orders/Follow-Ups/Clinician Recommendations:  Next Medicare Annual Wellness Visit:   December 20, 2024 at 2:30 pm telephone visit.   An order for a bone density scan has been placed for you today. Please call the number below to schedule your appt.   [facility] [address] [place], [place] [address] [phone] Make sure to wear two-piece clothing.  No lotions powders or deodorants the day of the appointment Make sure to bring picture ID and insurance card.  Bring list of medications you are currently taking including any supplements.   This is a list of the screening recommended for you and due dates:  Health Maintenance  Topic Date Due   DEXA scan (bone density measurement)  01/05/2023   COVID-19 Vaccine (1 - 2024-25 season) 06/15/2023   Complete foot exam   11/08/2023   Hemoglobin A1C  03/03/2024   Yearly kidney function blood test for diabetes  03/12/2024   Eye exam for diabetics  04/30/2024   Yearly kidney health urinalysis for diabetes  09/03/2024   Mammogram  09/17/2024   Medicare Annual Wellness Visit  12/16/2024   Cologuard (Stool DNA test)  09/17/2026   DTaP/Tdap/Td vaccine (2 - Td or Tdap) 02/08/2028   Pneumonia Vaccine  Completed   Flu Shot  Completed   Hepatitis C Screening  Completed   Zoster (Shingles) Vaccine  Completed   HPV Vaccine  Aged Out   Colon Cancer Screening  Discontinued    Advanced directives: (Declined) Advance directive discussed with you today. Even though you declined this today, please call our office should you change your mind, and we can give you the proper paperwork for you to fill out.  Next Medicare Annual Wellness Visit scheduled for next year:  yes  Understanding Your Risk for Falls Millions of people have serious injuries from falls each year. It is important to understand your risk of falling. Talk with your health care provider about your risk and what you can do to lower it. If you do have a serious fall, make sure to tell your provider. Falling once raises your risk of falling again. How can falls affect me? Serious injuries from falls are common. These include: Broken bones, such as hip fractures. Head injuries, such as traumatic brain injuries (TBI) or concussions. A fear of falling can cause you to avoid activities and stay at home. This can make your muscles weaker and raise your risk for a fall. What can increase my risk? There are a number of risk factors that increase your risk for falling. The more risk factors you have, the higher your risk of falling. Serious injuries from a fall happen most often to people who are older than 69 years old. Teenagers and young adults ages 50-29 are also at higher risk. Common risk factors include: Weakness in the lower body. Being generally weak or confused due to long-term (chronic) illness. Dizziness or balance problems. Poor vision. Medicines that cause dizziness or drowsiness. These may include: Medicines for your blood pressure, heart, anxiety, insomnia, or swelling (edema). Pain medicines. Muscle relaxants. Other risk factors include: Drinking alcohol. Having had a fall in the past. Having foot pain or wearing improper footwear. Working at a dangerous job.  Having any of the following in your home: Tripping hazards, such as floor clutter or loose rugs. Poor lighting. Pets. Having dementia or memory loss. What actions can I take to lower my risk of falling?     Physical activity Stay physically fit. Do strength and balance exercises. Consider taking a regular class to build strength and balance. Yoga and tai chi are good options. Vision Have your eyes checked every  year and your prescription for glasses or contacts updated as needed. Shoes and walking aids Wear non-skid shoes. Wear shoes that have rubber soles and low heels. Do not wear high heels. Do not walk around the house in socks or slippers. Use a cane or walker as told by your provider. Home safety Attach secure railings on both sides of your stairs. Install grab bars for your bathtub, shower, and toilet. Use a non-skid mat in your bathtub or shower. Attach bath mats securely with double-sided, non-slip rug tape. Use good lighting in all rooms. Keep a flashlight near your bed. Make sure there is a clear path from your bed to the bathroom. Use night-lights. Do not use throw rugs. Make sure all carpeting is taped or tacked down securely. Remove all clutter from walkways and stairways, including extension cords. Repair uneven or broken steps and floors. Avoid walking on icy or slippery surfaces. Walk on the grass instead of on icy or slick sidewalks. Use ice melter to get rid of ice on walkways in the winter. Use a cordless phone. Questions to ask your health care provider Can you help me check my risk for a fall? Do any of my medicines make me more likely to fall? Should I take a vitamin D supplement? What exercises can I do to improve my strength and balance? Should I make an appointment to have my vision checked? Do I need a bone density test to check for weak bones (osteoporosis)? Would it help to use a cane or a walker? Where to find more information Centers for Disease Control and Prevention, STEADI: TonerPromos.no Community-Based Fall Prevention Programs: TonerPromos.no [employer] on Aging: BaseRingTones.pl Contact a health care provider if: You fall at home. You are afraid of falling at home. You feel weak, drowsy, or dizzy. This information is not intended to replace advice given to you by your health care provider. Make sure you discuss any questions you have with your health care  provider. Document Revised: 06/03/2022 Document Reviewed: 06/03/2022 Elsevier Patient Education  2024 [employer].

## 2023-12-24 ENCOUNTER — Other Ambulatory Visit: Payer: Self-pay | Admitting: Internal Medicine

## 2023-12-31 ENCOUNTER — Telehealth: Payer: Self-pay

## 2023-12-31 NOTE — Telephone Encounter (Signed)
 Patient was identified as falling into the [place] Measure - Diabetes.   Patient was: Appointment scheduled for lab or office visit for A1c.

## 2024-01-08 DIAGNOSIS — E663 Overweight: Secondary | ICD-10-CM | POA: Diagnosis not present

## 2024-01-08 DIAGNOSIS — L239 Allergic contact dermatitis, unspecified cause: Secondary | ICD-10-CM | POA: Diagnosis not present

## 2024-01-08 DIAGNOSIS — R030 Elevated blood-pressure reading, without diagnosis of hypertension: Secondary | ICD-10-CM | POA: Diagnosis not present

## 2024-01-08 DIAGNOSIS — Z6828 Body mass index (BMI) 28.0-28.9, adult: Secondary | ICD-10-CM | POA: Diagnosis not present

## 2024-01-22 ENCOUNTER — Other Ambulatory Visit: Payer: Self-pay | Admitting: Internal Medicine

## 2024-01-22 DIAGNOSIS — E1165 Type 2 diabetes mellitus with hyperglycemia: Secondary | ICD-10-CM

## 2024-01-26 ENCOUNTER — Encounter: Payer: Self-pay | Admitting: Internal Medicine

## 2024-01-26 ENCOUNTER — Ambulatory Visit (INDEPENDENT_AMBULATORY_CARE_PROVIDER_SITE_OTHER): Admitting: Internal Medicine

## 2024-01-26 ENCOUNTER — Ambulatory Visit: Payer: Self-pay

## 2024-01-26 VITALS — BP 106/66 | HR 92 | Ht 62.5 in | Wt 155.2 lb

## 2024-01-26 DIAGNOSIS — L500 Allergic urticaria: Secondary | ICD-10-CM | POA: Diagnosis not present

## 2024-01-26 DIAGNOSIS — L508 Other urticaria: Secondary | ICD-10-CM | POA: Insufficient documentation

## 2024-01-26 DIAGNOSIS — R21 Rash and other nonspecific skin eruption: Secondary | ICD-10-CM | POA: Diagnosis not present

## 2024-01-26 DIAGNOSIS — L509 Urticaria, unspecified: Secondary | ICD-10-CM | POA: Insufficient documentation

## 2024-01-26 MED ORDER — HYDROXYZINE HCL 10 MG PO TABS
10.0000 mg | ORAL_TABLET | Freq: Three times a day (TID) | ORAL | 0 refills | Status: DC | PRN
Start: 2024-01-26 — End: 2024-08-06

## 2024-01-26 NOTE — Progress Notes (Signed)
 Acute Office Visit  Subjective:    Patient ID: Jennifer Velasquez, female    DOB: [birth year], 68 y.o.   MRN: [id number]  Chief Complaint  Patient presents with   Allergic Reaction    Pt reports sx of allergic reaction, was seen by [facility] this morning was told it was uriticaria and told to f/u with pcp , has rash on chest, belly, arms and on her face. Ongoing since a year ago on and off. Worse this time.     HPI Patient is in today for complaint of a rash on the chest wall, abdominal wall and bilateral upper extremities since yesterday.  She went to urgent care, was told of urticaria and was given oral prednisone.  She has had similar rash multiple times in the past, and has been treated with oral steroids.  She has had dermatology evaluation and was given topical creams, but still had recurrent rash.  She denies any new medicine recently.  She denies any exposure to new chemicals such as soap, shampoo, lotion or moisturizer, detergent or cleaning supplies.  Denies any lip swelling, dyspnea or dysphagia.  Past Medical History:  Diagnosis Date   Back pain    CARPAL TUNNEL SYNDROME, LEFT 06/02/2008   Qualifier: Diagnosis of  By: Erby Pian MD, Cornelius     COVID-19 virus infection 09/29/2019   DEPRESSION, SITUATIONAL 11/25/2008   Qualifier: Diagnosis of  Problem Stop Reason:  By: Erby Pian MD, Jamelle Rushing about COVID-19 virus infection 10/21/2019   LYMPHADENOPATHY 05/28/2010   Qualifier: Diagnosis of  By: Garnette Czech PA, Dawn     Neck pain    left    Need for prophylactic hormone replacement therapy (postmenopausal)    Obesity, unspecified    Other and unspecified hyperlipidemia    Routine general medical examination at a health care facility    Trichomonal vaginitis    Type II or unspecified type diabetes mellitus without mention of complication, not stated as uncontrolled    Unspecified constipation    Unspecified essential hypertension     Past Surgical History:  Procedure  Laterality Date   APPENDECTOMY     DENTAL SURGERY     TOTAL ABDOMINAL HYSTERECTOMY     fibroids    Family History  Problem Relation Age of Onset   Pneumonia Mother    Transient ischemic attack Father    Hypertension Father    Cancer Father    Hypertension Sister    Hypertension Sister    Cancer Daughter        breast     Social History   Socioeconomic History   Marital status: Single    Spouse name: Not on file   Number of children: 2   Years of education: 12th   Highest education level: Not on file  Occupational History   Occupation: Production designer, theatre/television/film of [employer]   Tobacco Use   Smoking status: Former    Types: Cigarettes   Smokeless tobacco: Never  Vaping Use   Vaping status: Never Used  Substance and Sexual Activity   Alcohol use: Yes   Drug use: No   Sexual activity: Not Currently  Other Topics Concern   Not on file  Social History Narrative   Lives with daughter Randa Evens) and granddaughter Lequita Halt)   Son (antwan- lives in [place]) 2 grand babies (boys)      Enjoys spends time with Lequita Halt, reads books, spends time with friend      Diet: bread, pasta,  veggies, bacon    Caffeine: frozen coffee, soda -daily   Water: 3 bottles of water      Wears seat belt   Doesn't not wear sunscreen   Smoke detectors   Report not using phone while driving    Social Drivers of Health   Financial Resource Strain: Low Risk  (12/17/2023)   Overall Financial Resource Strain (CARDIA)    Difficulty of Paying Living Expenses: Not hard at all  Food Insecurity: No Food Insecurity (12/17/2023)   Hunger Vital Sign    Worried About Running Out of Food in the Last Year: Never true    Ran Out of Food in the Last Year: Never true  Transportation Needs: No Transportation Needs (12/17/2023)   PRAPARE - Administrator, Civil Service (Medical): No    Lack of Transportation (Non-Medical): No  Physical Activity: Insufficiently Active (12/17/2023)   Exercise Vital Sign    Days of Exercise per  Week: 3 days    Minutes of Exercise per Session: 20 min  Stress: No Stress Concern Present (12/17/2023)   [employer] of Occupational Health - Occupational Stress Questionnaire    Feeling of Stress : Not at all  Social Connections: Moderately Isolated (12/17/2023)   Social Connection and Isolation Panel [NHANES]    Frequency of Communication with Friends and Family: More than three times a week    Frequency of Social Gatherings with Friends and Family: Three times a week    Attends Religious Services: More than 4 times per year    Active Member of Clubs or Organizations: No    Attends Banker Meetings: Never    Marital Status: Never married  Intimate Partner Violence: Not At Risk (12/17/2023)   Humiliation, Afraid, Rape, and Kick questionnaire    Fear of Current or Ex-Partner: No    Emotionally Abused: No    Physically Abused: No    Sexually Abused: No    Outpatient Medications Prior to Visit  Medication Sig Dispense Refill   cyclobenzaprine (FLEXERIL) 10 MG tablet Take 1 tablet (10 mg total) by mouth 2 (two) times daily as needed. 20 tablet 0   GNP VITAMIN D3 EXTRA STRENGTH 25 MCG (1000 UT) tablet TAKE ONE TABLET BY MOUTH ONCE DAILY 90 tablet 1   JARDIANCE 25 MG TABS tablet TAKE ONE TABLET BY MOUTH EVERY MORNING BEFORE BREAKFAST 30 tablet 8   lisinopril (ZESTRIL) 20 MG tablet TAKE ONE TABLET BY MOUTH ONCE DAILY 90 tablet 3   metFORMIN (GLUCOPHAGE-XR) 500 MG 24 hr tablet TAKE 4 TABLETS BY MOUTH EVERY DAY WITH WITH BREAKFAST 360 tablet 0   Multiple Vitamin (MULTIVITAMIN WITH MINERALS) TABS tablet Take 1 tablet by mouth daily.     predniSONE (STERAPRED UNI-PAK 21 TAB) 10 MG (21) TBPK tablet 10 mg DS 12 as directed 48 tablet 0   rosuvastatin (CRESTOR) 20 MG tablet Take 1 tablet (20 mg total) by mouth daily. 90 tablet 3   triamcinolone cream (KENALOG) 0.1 % Apply 1 Application topically 2 (two) times daily. 30 g 1   TRUE METRIX BLOOD GLUCOSE TEST test strip      TRUEplus  Lancets 30G MISC      diphenhydrAMINE (BENADRYL) 25 MG tablet Take 25 mg by mouth every 6 (six) hours as needed for itching.     No facility-administered medications prior to visit.    Allergies  Allergen Reactions   Pravastatin Sodium     REACTION: sore throat    Review of Systems  Constitutional:  Negative for chills and fever.  HENT:  Negative for congestion, sinus pressure, sinus pain and sore throat.   Eyes:  Negative for pain and discharge.  Respiratory:  Negative for cough and shortness of breath.   Cardiovascular:  Negative for chest pain and palpitations.  Gastrointestinal:  Negative for abdominal pain, diarrhea, nausea and vomiting.  Endocrine: Negative for polydipsia and polyuria.  Genitourinary:  Negative for dysuria and hematuria.  Musculoskeletal:  Negative for neck pain and neck stiffness.  Skin:  Positive for rash.  Neurological:  Negative for dizziness and weakness.  Psychiatric/Behavioral:  Negative for agitation and behavioral problems.        Objective:    Physical Exam Vitals reviewed.  Constitutional:      General: She is not in acute distress.    Appearance: She is not diaphoretic.  HENT:     Head: Normocephalic and atraumatic.     Nose: Nose normal.     Mouth/Throat:     Mouth: Mucous membranes are moist.  Eyes:     General: No scleral icterus.    Extraocular Movements: Extraocular movements intact.  Cardiovascular:     Rate and Rhythm: Normal rate and regular rhythm.     Heart sounds: Normal heart sounds. No murmur heard. Pulmonary:     Breath sounds: Normal breath sounds. No wheezing or rales.  Musculoskeletal:     Cervical back: Neck supple. No tenderness.     Right lower leg: No edema.     Left lower leg: No edema.  Skin:    General: Skin is warm.     Findings: Rash (Erythematous eruptions over chest wall, abdominal wall and bilateral upper extremities) present.  Neurological:     General: No focal deficit present.     Mental  Status: She is alert and oriented to person, place, and time.  Psychiatric:        Mood and Affect: Mood normal.        Behavior: Behavior normal.     BP 106/66   Pulse 92   Ht 5' 2.5" (1.588 m)   Wt 155 lb 3.2 oz (70.4 kg)   SpO2 98%   BMI 27.93 kg/m  Wt Readings from Last 3 Encounters:  01/26/24 155 lb 3.2 oz (70.4 kg)  12/17/23 154 lb (69.9 kg)  09/23/23 151 lb (68.5 kg)        Assessment & Plan:   Problem List Items Addressed This Visit       Musculoskeletal and Integument   Urticaria - Primary   Unclear allergen Advised to keep food diary Hydroxyzine as needed for itching, has tried Benadryl and Zyrtec without much relief Continue oral prednisone as prescribed from urgent care Refer to allergy and immunology clinic      Relevant Medications   hydrOXYzine (ATARAX) 10 MG tablet   Other Relevant Orders   Ambulatory referral to Allergy     Meds ordered this encounter  Medications   hydrOXYzine (ATARAX) 10 MG tablet    Sig: Take 1 tablet (10 mg total) by mouth every 8 (eight) hours as needed.    Dispense:  30 tablet    Refill:  0     Rodolphe Edmonston Alyssa Backbone, MD

## 2024-01-26 NOTE — Patient Instructions (Addendum)
 Please complete course of oral Prednisone.  Please take Hydroxyzine as needed for itching.  Once rash improves, please start taking Cetirizine regularly instead of Hydroxyzine for allergies.  Please keep food diary.

## 2024-01-26 NOTE — Telephone Encounter (Signed)
 Reason for Triage: Allergic reaction on skin.. Urgent care asked her to see the doctor.. worsening.. [phone]

## 2024-01-26 NOTE — Telephone Encounter (Signed)
   Copied from CRM [id number]. Topic: Appointments - Appointment Scheduling >> Jan 26, 2024 12:05 PM Emylou G wrote: Allergic reactions on skin.. Urgent care asked her to f/u with [facility] asapThis encounter was created in error - please disregard.

## 2024-01-26 NOTE — Assessment & Plan Note (Signed)
 Unclear allergen Advised to keep food diary Hydroxyzine as needed for itching, has tried Benadryl and Zyrtec without much relief Continue oral prednisone as prescribed from urgent care Refer to allergy and immunology clinic

## 2024-01-26 NOTE — Telephone Encounter (Signed)
 Per request; apt made for today;  PA has seen rash and asked her to f/u with pcp.

## 2024-02-05 ENCOUNTER — Ambulatory Visit: Payer: Medicare Other | Admitting: Internal Medicine

## 2024-02-21 ENCOUNTER — Other Ambulatory Visit: Payer: Self-pay | Admitting: Internal Medicine

## 2024-02-21 DIAGNOSIS — E1165 Type 2 diabetes mellitus with hyperglycemia: Secondary | ICD-10-CM

## 2024-02-21 DIAGNOSIS — E1169 Type 2 diabetes mellitus with other specified complication: Secondary | ICD-10-CM

## 2024-03-11 ENCOUNTER — Encounter: Payer: Self-pay | Admitting: Internal Medicine

## 2024-03-11 ENCOUNTER — Ambulatory Visit (INDEPENDENT_AMBULATORY_CARE_PROVIDER_SITE_OTHER): Admitting: Internal Medicine

## 2024-03-11 VITALS — BP 135/78 | HR 84 | Ht 62.0 in | Wt 151.8 lb

## 2024-03-11 DIAGNOSIS — Z7984 Long term (current) use of oral hypoglycemic drugs: Secondary | ICD-10-CM

## 2024-03-11 DIAGNOSIS — E559 Vitamin D deficiency, unspecified: Secondary | ICD-10-CM

## 2024-03-11 DIAGNOSIS — I10 Essential (primary) hypertension: Secondary | ICD-10-CM | POA: Diagnosis not present

## 2024-03-11 DIAGNOSIS — E785 Hyperlipidemia, unspecified: Secondary | ICD-10-CM

## 2024-03-11 DIAGNOSIS — M8580 Other specified disorders of bone density and structure, unspecified site: Secondary | ICD-10-CM | POA: Insufficient documentation

## 2024-03-11 DIAGNOSIS — L509 Urticaria, unspecified: Secondary | ICD-10-CM

## 2024-03-11 DIAGNOSIS — E1165 Type 2 diabetes mellitus with hyperglycemia: Secondary | ICD-10-CM | POA: Diagnosis not present

## 2024-03-11 DIAGNOSIS — E1169 Type 2 diabetes mellitus with other specified complication: Secondary | ICD-10-CM

## 2024-03-11 DIAGNOSIS — E663 Overweight: Secondary | ICD-10-CM | POA: Diagnosis not present

## 2024-03-11 MED ORDER — LISINOPRIL 20 MG PO TABS
20.0000 mg | ORAL_TABLET | Freq: Every day | ORAL | 3 refills | Status: AC
Start: 2024-03-11 — End: ?

## 2024-03-11 MED ORDER — EMPAGLIFLOZIN 25 MG PO TABS
25.0000 mg | ORAL_TABLET | Freq: Every day | ORAL | 8 refills | Status: AC
Start: 2024-03-11 — End: ?

## 2024-03-11 MED ORDER — METFORMIN HCL ER 500 MG PO TB24
1000.0000 mg | ORAL_TABLET | Freq: Two times a day (BID) | ORAL | 3 refills | Status: AC
Start: 2024-03-11 — End: ?

## 2024-03-11 MED ORDER — ROSUVASTATIN CALCIUM 20 MG PO TABS
20.0000 mg | ORAL_TABLET | Freq: Every day | ORAL | 3 refills | Status: AC
Start: 2024-03-11 — End: ?

## 2024-03-11 NOTE — Assessment & Plan Note (Signed)
 Adequately controlled on current antihypertensive regimen.

## 2024-03-11 NOTE — Assessment & Plan Note (Signed)
 Recently evaluated for an acute visit in the setting of urticaria.  She has been taking cetirizine daily and hydroxyzine  as needed.  Referred to allergy and immunology.  Appointment is currently scheduled for 6/16.

## 2024-03-11 NOTE — Assessment & Plan Note (Signed)
 T-score -1.7 on DEXA from March 2022.  Repeat DEXA is pending.  She is currently taking daily vitamin D  supplementation.  Repeat vitamin D  level ordered today.

## 2024-03-11 NOTE — Progress Notes (Signed)
 Established Patient Office Visit  Subjective   Patient ID: Jennifer Velasquez, female    DOB: [birth year]  Age: 68 y.o. MRN: [id number]  Chief Complaint  Patient presents with   Diabetes    Follow up    Jennifer Velasquez returns to care today for routine follow-up.  She was last evaluated by me in November 2024.  No medication changes were made at that time, repeat labs ordered, and orthopedic surgery referral placed in the setting of right hand pain.  49-month follow-up was arranged for reassessment.  In the interim, she was evaluated by orthopedic surgery in the setting of right hand pain and radicular right arm pain.  Most recently, seen at [facility] for an acute visit 4/14 in the setting of urticaria.  There have otherwise been no acute interval events.  Today she reports feeling well and has no acute concerns to discuss.  Past Medical History:  Diagnosis Date   Back pain    CARPAL TUNNEL SYNDROME, LEFT 06/02/2008   Qualifier: Diagnosis of  By: Nana Avers MD, Cornelius     COVID-19 virus infection 09/29/2019   DEPRESSION, SITUATIONAL 11/25/2008   Qualifier: Diagnosis of  Problem Stop Reason:  By: Nana Avers MD, Cleotis Daily about COVID-19 virus infection 10/21/2019   LYMPHADENOPATHY 05/28/2010   Qualifier: Diagnosis of  By: Doralee Gallop PA, Dawn     Neck pain    left    Need for prophylactic hormone replacement therapy (postmenopausal)    Obesity, unspecified    Other and unspecified hyperlipidemia    Routine general medical examination at a health care facility    Trichomonal vaginitis    Type II or unspecified type diabetes mellitus without mention of complication, not stated as uncontrolled    Unspecified constipation    Unspecified essential hypertension    Past Surgical History:  Procedure Laterality Date   APPENDECTOMY     DENTAL SURGERY     TOTAL ABDOMINAL HYSTERECTOMY     fibroids   Social History   Tobacco Use   Smoking status: Former    Types: Cigarettes   Smokeless tobacco: Never   Vaping Use   Vaping status: Never Used  Substance Use Topics   Alcohol use: Yes   Drug use: No   Family History  Problem Relation Age of Onset   Pneumonia Mother    Transient ischemic attack Father    Hypertension Father    Cancer Father    Hypertension Sister    Hypertension Sister    Cancer Daughter        breast    Allergies  Allergen Reactions   Pravastatin Sodium     REACTION: sore throat   Review of Systems  Constitutional:  Negative for chills and fever.  HENT:  Negative for sore throat.   Respiratory:  Negative for cough and shortness of breath.   Cardiovascular:  Negative for chest pain, palpitations and leg swelling.  Gastrointestinal:  Negative for abdominal pain, blood in stool, constipation, diarrhea, nausea and vomiting.  Genitourinary:  Negative for dysuria and hematuria.  Musculoskeletal:  Negative for myalgias.  Skin:  Negative for itching and rash.  Neurological:  Negative for dizziness and headaches.  Psychiatric/Behavioral:  Negative for depression and suicidal ideas.      Objective:     BP 135/78   Pulse 84   Ht 5\' 2"  (1.575 m)   Wt 151 lb 12.8 oz (68.9 kg)   SpO2 94%   BMI 27.76 kg/m  BP Readings from Last 3 Encounters:  03/11/24 135/78  01/26/24 106/66  09/23/23 117/80   Physical Exam Vitals reviewed.  Constitutional:      General: She is not in acute distress.    Appearance: Normal appearance. She is not toxic-appearing.  HENT:     Head: Normocephalic and atraumatic.     Right Ear: External ear normal.     Left Ear: External ear normal.     Nose: Nose normal. No congestion or rhinorrhea.     Mouth/Throat:     Mouth: Mucous membranes are moist.     Pharynx: Oropharynx is clear. No oropharyngeal exudate or posterior oropharyngeal erythema.  Eyes:     General: No scleral icterus.    Extraocular Movements: Extraocular movements intact.     Conjunctiva/sclera: Conjunctivae normal.     Pupils: Pupils are equal, round, and reactive  to light.  Cardiovascular:     Rate and Rhythm: Normal rate and regular rhythm.     Pulses: Normal pulses.     Heart sounds: Normal heart sounds. No murmur heard.    No friction rub. No gallop.  Pulmonary:     Effort: Pulmonary effort is normal.     Breath sounds: Normal breath sounds. No wheezing, rhonchi or rales.  Abdominal:     General: Abdomen is flat. Bowel sounds are normal. There is no distension.     Palpations: Abdomen is soft.     Tenderness: There is no abdominal tenderness.  Musculoskeletal:        General: No swelling. Normal range of motion.     Cervical back: Normal range of motion.     Right lower leg: No edema.     Left lower leg: No edema.  Lymphadenopathy:     Cervical: No cervical adenopathy.  Skin:    General: Skin is warm and dry.     Capillary Refill: Capillary refill takes less than 2 seconds.     Coloration: Skin is not jaundiced.  Neurological:     General: No focal deficit present.     Mental Status: She is alert and oriented to person, place, and time.  Psychiatric:        Mood and Affect: Mood normal.        Behavior: Behavior normal.   Last CBC Lab Results  Component Value Date   WBC 6.1 03/13/2023   HGB 14.6 03/13/2023   HCT 42.4 03/13/2023   MCV 85 03/13/2023   MCH 29.3 03/13/2023   RDW 12.5 03/13/2023   PLT 313 03/13/2023   Last metabolic panel Lab Results  Component Value Date   GLUCOSE 129 (H) 03/13/2023   NA 139 03/13/2023   K 4.4 03/13/2023   CL 100 03/13/2023   CO2 22 03/13/2023   BUN 13 03/13/2023   CREATININE 0.78 03/13/2023   EGFR 83 03/13/2023   CALCIUM  9.9 03/13/2023   PROT 7.6 03/13/2023   ALBUMIN 4.5 03/13/2023   LABGLOB 3.1 03/13/2023   AGRATIO 1.5 03/13/2023   BILITOT 0.4 03/13/2023   ALKPHOS 86 03/13/2023   AST 10 03/13/2023   ALT 11 03/13/2023   ANIONGAP 7 09/07/2020   Last lipids Lab Results  Component Value Date   CHOL 117 09/04/2023   HDL 41 09/04/2023   LDLCALC 61 09/04/2023   TRIG 71  09/04/2023   CHOLHDL 2.9 09/04/2023   Last hemoglobin A1c Lab Results  Component Value Date   HGBA1C 8.2 (H) 09/04/2023   Last thyroid  functions Lab Results  Component Value Date   TSH 1.370 03/13/2023   Last vitamin D  Lab Results  Component Value Date   VD25OH 33.7 03/13/2023   Last vitamin B12 and Folate Lab Results  Component Value Date   VITAMINB12 560 03/13/2023   FOLATE 16.1 03/13/2023     Assessment & Plan:   Problem List Items Addressed This Visit       Essential hypertension - Primary   Adequately controlled on current antihypertensive regimen.      Type 2 diabetes mellitus with hyperglycemia (HCC)   A1c 8.2 on labs from November 2024.  She is currently prescribed metformin  XR 1000 mg twice daily and Jardiance  25 mg daily.  Repeat A1c ordered today.      Hyperlipidemia associated with type 2 diabetes mellitus (HCC)   Lipid panel updated in November 2024 reflects excellent control with rosuvastatin  20 mg daily.      Urticaria   Recently evaluated for an acute visit in the setting of urticaria.  She has been taking cetirizine daily and hydroxyzine  as needed.  Referred to allergy and immunology.  Appointment is currently scheduled for 6/16.      Osteopenia   T-score -1.7 on DEXA from March 2022.  Repeat DEXA is pending.  She is currently taking daily vitamin D  supplementation.  Repeat vitamin D  level ordered today.      Return in about 6 months (around 09/11/2024).   Tobi Fortes, MD

## 2024-03-11 NOTE — Assessment & Plan Note (Signed)
 A1c 8.2 on labs from November 2024.  She is currently prescribed metformin  XR 1000 mg twice daily and Jardiance  25 mg daily.  Repeat A1c ordered today.

## 2024-03-11 NOTE — Patient Instructions (Signed)
 It was a pleasure to see you today.  Thank you for giving [place] the opportunity to be involved in your care.  Below is a brief recap of your visit and next steps.  We will plan to see you again in 6 months.  Summary No medication changes today Refills provided Repeat labs ordered Follow up in 6 months

## 2024-03-11 NOTE — Assessment & Plan Note (Signed)
 Lipid panel updated in November 2024 reflects excellent control with rosuvastatin  20 mg daily.

## 2024-03-12 ENCOUNTER — Ambulatory Visit: Payer: Self-pay | Admitting: Internal Medicine

## 2024-03-12 DIAGNOSIS — E1165 Type 2 diabetes mellitus with hyperglycemia: Secondary | ICD-10-CM

## 2024-03-12 LAB — CBC WITH DIFFERENTIAL/PLATELET
Basophils Absolute: 0 10*3/uL (ref 0.0–0.2)
Basos: 0 %
EOS (ABSOLUTE): 0.1 10*3/uL (ref 0.0–0.4)
Eos: 2 %
Hematocrit: 45.6 % (ref 34.0–46.6)
Hemoglobin: 15.1 g/dL (ref 11.1–15.9)
Immature Grans (Abs): 0 10*3/uL (ref 0.0–0.1)
Immature Granulocytes: 0 %
Lymphocytes Absolute: 2.3 10*3/uL (ref 0.7–3.1)
Lymphs: 50 %
MCH: 29.6 pg (ref 26.6–33.0)
MCHC: 33.1 g/dL (ref 31.5–35.7)
MCV: 89 fL (ref 79–97)
Monocytes Absolute: 0.4 10*3/uL (ref 0.1–0.9)
Monocytes: 8 %
Neutrophils Absolute: 1.8 10*3/uL (ref 1.4–7.0)
Neutrophils: 40 %
Platelets: 355 10*3/uL (ref 150–450)
RBC: 5.1 x10E6/uL (ref 3.77–5.28)
RDW: 12.9 % (ref 11.7–15.4)
WBC: 4.5 10*3/uL (ref 3.4–10.8)

## 2024-03-12 LAB — CMP14+EGFR
ALT: 10 IU/L (ref 0–32)
AST: 16 IU/L (ref 0–40)
Albumin: 4.3 g/dL (ref 3.9–4.9)
Alkaline Phosphatase: 92 IU/L (ref 44–121)
BUN/Creatinine Ratio: 13 (ref 12–28)
BUN: 11 mg/dL (ref 8–27)
Bilirubin Total: 0.5 mg/dL (ref 0.0–1.2)
CO2: 21 mmol/L (ref 20–29)
Calcium: 9.8 mg/dL (ref 8.7–10.3)
Chloride: 103 mmol/L (ref 96–106)
Creatinine, Ser: 0.84 mg/dL (ref 0.57–1.00)
Globulin, Total: 2.6 g/dL (ref 1.5–4.5)
Glucose: 104 mg/dL — ABNORMAL HIGH (ref 70–99)
Potassium: 4.3 mmol/L (ref 3.5–5.2)
Sodium: 139 mmol/L (ref 134–144)
Total Protein: 6.9 g/dL (ref 6.0–8.5)
eGFR: 76 mL/min/{1.73_m2} (ref >59)

## 2024-03-12 LAB — B12 AND FOLATE PANEL
Folate: >20 ng/mL (ref >3.0)
Vitamin B-12: 506 pg/mL (ref 232–1245)

## 2024-03-12 LAB — HEMOGLOBIN A1C
Est. average glucose Bld gHb Est-mCnc: 214 mg/dL
Hgb A1c MFr Bld: 9.1 % — ABNORMAL HIGH (ref 4.8–5.6)

## 2024-03-12 LAB — TSH+FREE T4
Free T4: 1.16 ng/dL (ref 0.82–1.77)
TSH: 0.915 u[IU]/mL (ref 0.450–4.500)

## 2024-03-12 LAB — VITAMIN D 25 HYDROXY (VIT D DEFICIENCY, FRACTURES): Vit D, 25-Hydroxy: 29.9 ng/mL — ABNORMAL LOW (ref 30.0–100.0)

## 2024-03-12 MED ORDER — MOUNJARO 2.5 MG/0.5ML ~~LOC~~ SOAJ
2.5000 mg | SUBCUTANEOUS | 0 refills | Status: DC
Start: 2024-03-12 — End: 2024-08-06

## 2024-03-18 ENCOUNTER — Ambulatory Visit (HOSPITAL_COMMUNITY)
Admission: RE | Admit: 2024-03-18 | Discharge: 2024-03-18 | Disposition: A | Discharge status: Home / Self Care | Source: Ambulatory Visit | Attending: Internal Medicine | Admitting: Internal Medicine

## 2024-03-18 DIAGNOSIS — Z780 Asymptomatic menopausal state: Secondary | ICD-10-CM | POA: Diagnosis not present

## 2024-03-18 DIAGNOSIS — M8580 Other specified disorders of bone density and structure, unspecified site: Secondary | ICD-10-CM | POA: Diagnosis not present

## 2024-03-22 ENCOUNTER — Ambulatory Visit: Payer: Self-pay | Admitting: Internal Medicine

## 2024-03-26 ENCOUNTER — Ambulatory Visit: Payer: Self-pay | Admitting: Allergy & Immunology

## 2024-03-26 ENCOUNTER — Encounter: Payer: Self-pay | Admitting: Allergy & Immunology

## 2024-03-26 ENCOUNTER — Other Ambulatory Visit: Payer: Self-pay

## 2024-03-26 VITALS — BP 138/80 | HR 81 | Temp 97.2°F | Resp 18 | Ht 61.81 in | Wt 154.8 lb

## 2024-03-26 DIAGNOSIS — L299 Pruritus, unspecified: Secondary | ICD-10-CM | POA: Diagnosis not present

## 2024-03-26 DIAGNOSIS — L508 Other urticaria: Secondary | ICD-10-CM

## 2024-03-26 NOTE — Patient Instructions (Addendum)
 1. Pruritus and urticaria  - Your history does not have any red flags such as fevers, joint pains, or permanent skin changes that would be concerning for a more serious cause of hives.  - We are going to do some environmental allergy testing and testing to the most common foods at the next visit.  - We will get some labs to rule out serious causes of hives: alpha gal panel, complete blood count, tryptase level, chronic urticaria panel, CMP, ESR, and CRP. - Chronic hives are often times a self limited process and will burn themselves out over 6-12 months, although this is not always the case.  - In the meantime, start suppressive dosing of antihistamines to keep the itching at bay:   - Morning: Xyzal (levocetirizine) 5 mg + Pepcid (famotidine) 20mg   - Evening: Zyrtec (cetirizine) 10mg  + Pepcid (famotidine) 20mg  - You can change this dosing at home, decreasing the dose as needed or increasing the dosing as needed.  - If you are not tolerating the medications or are tired of taking them every day, we can start treatment with a monthly injectable medication called Xolair.   2. Allergic rhinoconjunctivitis  - Because of insurance stipulations, we cannot do skin testing on the same day as your first visit. - We are all working to fight this, but for now we need to do two separate visits.  - We will know more after we do testing at the next visit.  - The skin testing visit can be squeezed in at your convenience.  - Then we can make a more full plan to address all of your symptoms. - Be sure to stop your antihistamines for 3 days before this appointment.   3. Return in about 2 weeks (around 04/09/2024) for ALLERGY TESTING (1-68). You can have the follow up appointment with Dr. Idolina Maker or a Nurse Practicioner (our Nurse Practitioners are excellent and always have Physician oversight!).    Please inform us  of any Emergency Department visits, hospitalizations, or changes in symptoms. Call us  before  going to the ED for breathing or allergy symptoms since we might be able to fit you in for a sick visit. Feel free to contact us  anytime with any questions, problems, or concerns.  It was a pleasure to meet you today!  Websites that have reliable patient information: 1. American Academy of Asthma, Allergy, and Immunology: www.aaaai.org 2. Food Allergy Research and Education (FARE): foodallergy.org 3. Mothers of Asthmatics: http://www.asthmacommunitynetwork.org 4. American College of Allergy, Asthma, and Immunology: www.acaai.org      "Like" us  on Facebook and Instagram for our latest updates!      A healthy democracy works best when [employer] participate! Make sure you are registered to vote! If you have moved or changed any of your contact information, you will need to get this updated before voting! Scan the QR codes below to learn more!

## 2024-03-26 NOTE — Progress Notes (Signed)
 NEW PATIENT  Date of Service/Encounter:  03/26/24  Consult requested by: Alison Irvine, FNP   Assessment:   Pruritus  Chronic urticaria - getting labs and scheduling for skin testing  Improves with systemic steroids   Plan/Recommendations:   1. Pruritus and urticaria  - Your history does not have any red flags such as fevers, joint pains, or permanent skin changes that would be concerning for a more serious cause of hives.  - We are going to do some environmental allergy testing and testing to the most common foods at the next visit.  - We will get some labs to rule out serious causes of hives: alpha gal panel, complete blood count, tryptase level, chronic urticaria panel, CMP, ESR, and CRP. - Chronic hives are often times a self limited process and will burn themselves out over 6-12 months, although this is not always the case.  - In the meantime, start suppressive dosing of antihistamines to keep the itching at bay:   - Morning: Xyzal (levocetirizine) 5 mg + Pepcid (famotidine) 20mg   - Evening: Zyrtec (cetirizine) 10mg  + Pepcid (famotidine) 20mg  - You can change this dosing at home, decreasing the dose as needed or increasing the dosing as needed.  - If you are not tolerating the medications or are tired of taking them every day, we can start treatment with a monthly injectable medication called Xolair.   2. Allergic rhinoconjunctivitis  - Because of insurance stipulations, we cannot do skin testing on the same day as your first visit. - We are all working to fight this, but for now we need to do two separate visits.  - We will know more after we do testing at the next visit.  - The skin testing visit can be squeezed in at your convenience.  - Then we can make a more full plan to address all of your symptoms. - Be sure to stop your antihistamines for 3 days before this appointment.   3. Return in about 2 weeks (around 04/09/2024) for ALLERGY TESTING (1-68). You can have  the follow up appointment with Dr. Idolina Maker or a Nurse Practicioner (our Nurse Practitioners are excellent and always have Physician oversight!).   This note in its entirety was forwarded to the Provider who requested this consultation.  Subjective:   Jennifer Velasquez is a 68 y.o. female presenting today for evaluation of  Chief Complaint  Patient presents with   Urticaria    Been occurring since last summer, itching over her body not being able to sleep    Jennifer Velasquez has a history of the following: Patient Active Problem List   Diagnosis Date Noted   Osteopenia 03/11/2024   Urticaria 01/26/2024   Colon cancer screening 09/04/2023   Dysuria 03/13/2023   Right hand pain 03/13/2023   Left hip pain 03/13/2023   Encounter for well adult exam with abnormal findings 03/13/2023   Need for zoster vaccination 09/03/2022   Rash and nonspecific skin eruption 08/16/2022   Atopic dermatitis 12/13/2021   Immunization due 03/29/2021   Hemorrhoid 03/29/2021   Annual visit for general adult medical examination with abnormal findings 07/20/2020   Post-menopausal 07/20/2020   Encounter for screening for malignant neoplasm of colon 07/20/2020   Carpal tunnel syndrome on right 07/20/2020   Pap smear of cervix declined 07/20/2020   Need for immunization against influenza 07/20/2020   Vitamin D  deficiency 12/01/2011   Lumbago 07/20/2007   Type 2 diabetes mellitus with hyperglycemia (HCC) 01/07/2007  Hyperlipidemia associated with type 2 diabetes mellitus (HCC) 09/11/2006   Overweight (BMI 25.0-29.9) 09/11/2006   Essential hypertension 09/11/2006    History obtained from: chart review and patient.  Discussed the use of AI scribe software for clinical note transcription with the patient and/or guardian, who gave verbal consent to proceed.  Jennifer Velasquez was referred by Alison Irvine, FNP.     Teneisha is a 68 y.o. female presenting for an evaluation of urticaria and itching.  She has been  experiencing recurrent episodes of hives since last summer, initially starting on her leg and later spreading to her arm, mouth, and chest. The hives are pruritic and have caused discoloration of her skin, particularly on her leg and neck. The episodes tend to last for a few hours before subsiding.  [facility], where she was treated with steroids, which provided some relief but did not completely resolve the symptoms. She takes Zyrtec daily, which has helped to calm the symptoms. Initially, she used Benadryl  without relief and later switched to Zyrtec. She also used Clear Eyes for burning eyes, which provided relief.  She has not experienced significant throat swelling, although she once felt as if her throat was closing, which she attributes to possible panic. This is when the rash seems to be traveling onto her chest and her neck.  She denies any new exposures.  She is unsure what might have triggered the rash.  She has never seen dermatology for this.  She has seen her PCP, who continued her prednisone  hydroxyzine  as needed.  She did have a normal metabolic panel and complete blood count in May 2025.  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  For her clients recently died from causes unrelated to her care, so she is overall emotional about this.    Otherwise, there is no history of other atopic diseases, including food allergies, drug allergies, stinging insect allergies, or contact dermatitis. There is no significant infectious history. Vaccinations are up to date.    Past Medical History: Patient Active Problem List   Diagnosis Date Noted   Osteopenia 03/11/2024   Urticaria 01/26/2024   Colon cancer screening 09/04/2023   Dysuria 03/13/2023   Right hand pain 03/13/2023   Left hip pain 03/13/2023   Encounter for well adult exam with abnormal findings 03/13/2023    Need for zoster vaccination 09/03/2022   Rash and nonspecific skin eruption 08/16/2022   Atopic dermatitis 12/13/2021   Immunization due 03/29/2021   Hemorrhoid 03/29/2021   Annual visit for general adult medical examination with abnormal findings 07/20/2020   Post-menopausal 07/20/2020   Encounter for screening for malignant neoplasm of colon 07/20/2020   Carpal tunnel syndrome on right 07/20/2020   Pap smear of cervix declined 07/20/2020   Need for immunization against influenza 07/20/2020   Vitamin D  deficiency 12/01/2011   Lumbago 07/20/2007   Type 2 diabetes mellitus with hyperglycemia (HCC) 01/07/2007   Hyperlipidemia associated with type 2 diabetes mellitus (HCC) 09/11/2006   Overweight (BMI 25.0-29.9) 09/11/2006   Essential hypertension 09/11/2006    Medication List:  Allergies as of 03/26/2024       Reactions   Pravastatin Sodium    REACTION: sore throat        Medication List        Accurate as of March 26, 2024  1:15 PM. If you  have any questions, ask your nurse or doctor.          cyclobenzaprine  10 MG tablet Commonly known as: FLEXERIL  Take 1 tablet (10 mg total) by mouth 2 (two) times daily as needed.   empagliflozin  25 MG Tabs tablet Commonly known as: Jardiance  Take 1 tablet (25 mg total) by mouth daily with breakfast.   GNP Vitamin D3 Extra Strength 25 MCG (1000 UT) tablet Generic drug: Cholecalciferol TAKE ONE TABLET BY MOUTH ONCE DAILY   hydrOXYzine  10 MG tablet Commonly known as: ATARAX  Take 1 tablet (10 mg total) by mouth every 8 (eight) hours as needed.   lisinopril  20 MG tablet Commonly known as: ZESTRIL  Take 1 tablet (20 mg total) by mouth daily.   metFORMIN  500 MG 24 hr tablet Commonly known as: GLUCOPHAGE -XR Take 2 tablets (1,000 mg total) by mouth 2 (two) times daily with a meal.   Mounjaro  2.5 MG/0.5ML Pen Generic drug: tirzepatide  Inject 2.5 mg into the skin once a week.   multivitamin with minerals Tabs tablet Take 1  tablet by mouth daily.   rosuvastatin  20 MG tablet Commonly known as: CRESTOR  Take 1 tablet (20 mg total) by mouth daily.   triamcinolone  cream 0.1 % Commonly known as: KENALOG  Apply 1 Application topically 2 (two) times daily.   True Metrix Blood Glucose Test test strip Generic drug: glucose blood   TRUEplus Lancets 30G Misc        Birth History: non-contributory  Developmental History: non-contributory  Past Surgical History: Past Surgical History:  Procedure Laterality Date   ADENOIDECTOMY     APPENDECTOMY     DENTAL SURGERY     TOTAL ABDOMINAL HYSTERECTOMY     fibroids     Family History: Family History  Problem Relation Age of Onset   Pneumonia Mother    Transient ischemic attack Father    Hypertension Father    Cancer Father    Hypertension Sister    Hypertension Sister    Cancer Daughter        breast      Social History: Enyla lives at home with her family.  She lives in a house that is 69 years old.  There is laminate throughout the home.  There is electric heating and window units for cooling.  There are no animals inside or outside of the home.  There are dust mite covers on the bed, but on the pillows.  There is no tobacco exposure.  She currently works in a group home for the past 16 years assisting individuals with special needs.  There are no fumes, chemicals, or dust exposure.  There is no HEPA filter.  [place]   Review of systems otherwise negative other than that mentioned in the HPI.    Objective:   Blood pressure 138/80, pulse 81, temperature (!) 97.2 F (36.2 C), temperature source Temporal, resp. rate 18, height 5' 1.81 (1.57 m), weight 154 lb 12.8 oz (70.2 kg), SpO2 99%. Body mass index is 28.49 kg/m.     Physical Exam Constitutional:      Appearance: She is well-developed.  HENT:     Head: Normocephalic and atraumatic.     Right Ear: Tympanic membrane, ear canal and external ear  normal. No drainage, swelling or tenderness. Tympanic membrane is not injected, scarred, erythematous, retracted or bulging.     Left Ear: Tympanic membrane, ear canal and external ear normal. No drainage, swelling or tenderness. Tympanic membrane is  not injected, scarred, erythematous, retracted or bulging.     Nose: No nasal deformity, septal deviation, mucosal edema or rhinorrhea.     Right Sinus: No maxillary sinus tenderness or frontal sinus tenderness.     Left Sinus: No maxillary sinus tenderness or frontal sinus tenderness.     Mouth/Throat:     Mouth: Mucous membranes are not pale and not dry.     Pharynx: Uvula midline.   Eyes:     General:        Right eye: No discharge.        Left eye: No discharge.     Conjunctiva/sclera: Conjunctivae normal.     Right eye: Right conjunctiva is not injected. No chemosis.    Left eye: Left conjunctiva is not injected. No chemosis.    Pupils: Pupils are equal, round, and reactive to light.    Cardiovascular:     Rate and Rhythm: Normal rate and regular rhythm.     Heart sounds: Normal heart sounds.  Pulmonary:     Effort: Pulmonary effort is normal. No tachypnea, accessory muscle usage or respiratory distress.     Breath sounds: Normal breath sounds. No wheezing, rhonchi or rales.  Chest:     Chest wall: No tenderness.  Abdominal:     Tenderness: There is no abdominal tenderness. There is no guarding or rebound.  Lymphadenopathy:     Head:     Right side of head: No submandibular, tonsillar or occipital adenopathy.     Left side of head: No submandibular, tonsillar or occipital adenopathy.     Cervical: No cervical adenopathy.   Skin:    General: Skin is warm.     Capillary Refill: Capillary refill takes less than 2 seconds.     Coloration: Skin is not pale.     Findings: Rash present. No abrasion, erythema or petechiae. Rash is not papular, urticarial or vesicular.     Comments: She does have some discolored skin on her bilateral  lower extremities.  No residual skin changes anywhere else.  No eczematous changes.  Overall, this seems to be a fairly good day for her.   Neurological:     Mental Status: She is alert.      Diagnostic studies: deferred due to insurance stipulations that require a separate visit for testing       Drexel Gentles, MD [facility] 

## 2024-04-01 LAB — TRYPTASE: Tryptase: 3.9 ug/L (ref 2.2–13.2)

## 2024-04-01 LAB — THYROID ANTIBODIES (THYROPEROXIDASE & THYROGLOBULIN)
Thyroglobulin Antibody: <1 [IU]/mL (ref 0.0–0.9)
Thyroperoxidase Ab SerPl-aCnc: <9 [IU]/mL (ref 0–34)

## 2024-04-01 LAB — ANTINUCLEAR ANTIBODIES, IFA: ANA Titer 1: POSITIVE — AB

## 2024-04-01 LAB — FANA STAINING PATTERNS: Speckled Pattern: 1:640 {titer} — ABNORMAL HIGH

## 2024-04-01 LAB — ALPHA-GAL PANEL
Allergen Lamb IgE: <0.1 kU/L
Beef IgE: <0.1 kU/L
IgE (Immunoglobulin E), Serum: 16 [IU]/mL (ref 6–495)
O215-IgE Alpha-Gal: <0.1 kU/L
Pork IgE: <0.1 kU/L

## 2024-04-01 LAB — C-REACTIVE PROTEIN: CRP: 2 mg/L (ref 0–10)

## 2024-04-01 LAB — SEDIMENTATION RATE: Sed Rate: 15 mm/h (ref 0–40)

## 2024-04-07 ENCOUNTER — Ambulatory Visit: Payer: Self-pay | Admitting: Allergy & Immunology

## 2024-04-09 ENCOUNTER — Ambulatory Visit: Payer: No Typology Code available for payment source | Admitting: Allergy & Immunology

## 2024-04-09 DIAGNOSIS — J302 Other seasonal allergic rhinitis: Secondary | ICD-10-CM | POA: Diagnosis not present

## 2024-04-09 DIAGNOSIS — R768 Other specified abnormal immunological findings in serum: Secondary | ICD-10-CM

## 2024-04-09 DIAGNOSIS — J3089 Other allergic rhinitis: Secondary | ICD-10-CM

## 2024-04-09 NOTE — Progress Notes (Unsigned)
 FOLLOW UP  Date of Service/Encounter:  04/09/24   Assessment:   Pruritus   Chronic urticaria - getting labs and scheduling for skin testing   Improves with systemic steroids   Intermittent conjnuctivitis (present on the right side)  Plan/Recommendations:   Patient Instructions  1. Pruritus and urticaria - with elevated ANA (screen for autoimmune disease) - We are going to refer you to see Rheumatology since your ANA was so elevated. - They will look into things like Rheumatoid Arthritis and lupus and whatnot.  - Chronic hives are often times a self limited process and will burn themselves out over 6-12 months, although this is not always the case.  - In the meantime, continue with suppressive dosing of antihistamines to keep the itching at bay:   - Morning: Xyzal (levocetirizine) 5 mg + Pepcid (famotidine) 20mg   - Evening: Zyrtec (cetirizine) 10mg  + Pepcid (famotidine) 20mg  - You can change this dosing at home, decreasing the dose as needed or increasing the dosing as needed.  - If you are not tolerating the medications or are tired of taking them every day, we can start treatment with a monthly injectable medication called Xolair.   2. Allergic rhinoconjunctivitis  - Testing today showed: grasses, trees, indoor molds, cat, and dog - Copy of test results provided.  - Avoidance measures provided. - Continue with: all of your antihistamines, as listed above for your hives  - You can use an extra dose of the antihistamine, if needed, for breakthrough symptoms.  - Consider nasal saline rinses 1-2 times daily to remove allergens from the nasal cavities as well as help with mucous clearance (this is especially helpful to do before the nasal sprays are given) - Consider allergy shots as a means of long-term control. - Allergy shots re-train and reset the immune system to ignore environmental allergens and decrease the resulting immune response to those allergens (sneezing,  itchy watery eyes, runny nose, nasal congestion, etc).    - Allergy shots improve symptoms in 75-85% of patients.  - We can discuss more at the next appointment if the medications are not working for you.  3. Return in about 3 months (around 07/10/2024). You can have the follow up appointment with Dr. Iva or a Nurse Practicioner (our Nurse Practitioners are excellent and always have Physician oversight!).    Please inform us  of any Emergency Department visits, hospitalizations, or changes in symptoms. Call us  before going to the ED for breathing or allergy symptoms since we might be able to fit you in for a sick visit. Feel free to contact us  anytime with any questions, problems, or concerns.  It was a pleasure to meet you today!  Websites that have reliable patient information: 1. American Academy of Asthma, Allergy, and Immunology: www.aaaai.org 2. Food Allergy Research and Education (FARE): foodallergy.org 3. Mothers of Asthmatics: http://www.asthmacommunitynetwork.org 4. American College of Allergy, Asthma, and Immunology: www.acaai.org      "Like" us  on Facebook and Instagram for our latest updates!      A healthy democracy works best when [employer] participate! Make sure you are registered to vote! If you have moved or changed any of your contact information, you will need to get this updated before voting! Scan the QR codes below to learn more!        Airborne Adult Perc - 04/09/24 1026     Time Antigen Placed 1026    Allergen Manufacturer Jestine    Location Back    Number of  Test 55    1. Control-Buffer 50% Glycerol Negative    2. Control-Histamine 2+    3. Bahia Negative    4. [place] Negative    5. Johnson Negative    6. [place]  Blue Negative    7. Meadow Fescue Negative    8. Perennial Rye Negative    9. Timothy Negative    10. Ragweed Mix Negative    11. Cocklebur Negative    12. Plantain,  English Negative    13. Baccharis Negative    14. Dog  Fennel Negative    15. Russian Thistle Negative    16. Lamb's Quarters Negative    17. Sheep Sorrell Negative    18. Rough Pigweed Negative    19. Marsh Elder, Rough Negative    20. Mugwort, Common Negative    21. Box, Elder Negative    22. Cedar, red Negative    23. Sweet Gum Negative    24. Pecan Pollen Negative    25. Pine Mix Negative    26. Walnut, Black Pollen Negative    27. Red Mulberry Negative    28. Ash Mix Negative    29. Birch Mix Negative    30. Beech American Negative    31. Cottonwood, [place] Negative    32. Hickory, White Negative    33. Maple Mix Negative    34. Oak, [place] Mix Negative    35. Sycamore Eastern Negative    36. Alternaria Alternata Negative    37. Cladosporium Herbarum Negative    38. Aspergillus Mix Negative    39. Penicillium Mix Negative    40. Bipolaris Sorokiniana (Helminthosporium) Negative    41. Drechslera Spicifera (Curvularia) Negative    42. Mucor Plumbeus Negative    43. Fusarium Moniliforme Negative    44. Aureobasidium Pullulans (pullulara) Negative    45. Rhizopus Oryzae Negative    46. Botrytis Cinera Negative    47. Epicoccum Nigrum Negative    48. Phoma Betae Negative    49. Dust Mite Mix Negative    50. Cat Hair 10,000 BAU/ml Negative    51.  Dog Epithelia Negative    52. Mixed Feathers Negative    53. Horse Epithelia Negative    54. Cockroach, German Negative    55. Tobacco Leaf Negative          13 Food Perc - 04/09/24 1027       Test Information   Time Antigen Placed 1027    Allergen Manufacturer Jestine    Location Back    Number of allergen test 13      Food   1. Peanut Negative    2. Soybean Negative    3. Wheat Negative    4. Sesame Negative    5. Milk, Cow Negative    6. Casein Negative    7. Egg White, Chicken Negative    8. Shellfish Mix Negative    9. Fish Mix Negative    10. Cashew Negative    11. Walnut Food Negative    12. Almond Negative    13. Hazelnut Negative           Intradermal - 04/09/24 1100     Time Antigen Placed 1100    Allergen Manufacturer Greer    Location Arm    Number of Test 16    Control Negative    Bahia 2+    [place] 2+    Johnson Negative    7 Grass Negative    Ragweed  Mix Negative    Weed Mix Negative    Tree Mix 2+    Mold 1 Negative    Mold 2 Negative    Mold 3 Negative    Mold 4 1+    Mite Mix Negative    Cat 2+    Dog 2+    Cockroach Negative          Reducing Pollen Exposure  The American Academy of Allergy, Asthma and Immunology suggests the following steps to reduce your exposure to pollen during allergy seasons.    Do not hang sheets or clothing out to dry; pollen may collect on these items. Do not mow lawns or spend time around freshly cut grass; mowing stirs up pollen. Keep windows closed at night.  Keep car windows closed while driving. Minimize morning activities outdoors, a time when pollen counts are usually at their highest. Stay indoors as much as possible when pollen counts or humidity is high and on windy days when pollen tends to remain in the air longer. Use air conditioning when possible.  Many air conditioners have filters that trap the pollen spores. Use a HEPA room air filter to remove pollen form the indoor air you breathe.  Control of Mold Allergen   Mold and fungi can grow on a variety of surfaces provided certain temperature and moisture conditions exist.  Outdoor molds grow on plants, decaying vegetation and soil.  The major outdoor mold, Alternaria and Cladosporium, are found in very high numbers during hot and dry conditions.  Generally, a late Summer - Fall peak is seen for common outdoor fungal spores.  Rain will temporarily lower outdoor mold spore count, but counts rise rapidly when the rainy period ends.  The most important indoor molds are Aspergillus and Penicillium.  Dark, humid and poorly ventilated basements are ideal sites for mold growth.  The next most common sites of mold  growth are the bathroom and the kitchen.  Outdoor (Seasonal) Mold Control   Use air conditioning and keep windows closed Avoid exposure to decaying vegetation. Avoid leaf raking. Avoid grain handling. Consider wearing a face mask if working in moldy areas.    Indoor (Perennial) Mold Control   Positive indoor molds via skin testing: {Blank multiple:19196:a:Aspergillus,Penicillium,Fusarium,Aureobasidium (Pullulara),Rhizopus,Botrytis,Phoma,Candida,Trichophyton}  Maintain humidity below 50%. Clean washable surfaces with 5% bleach solution. Remove sources e.g. contaminated carpets.    Control of Dog or Cat Allergen  Avoidance is the best way to manage a dog or cat allergy. If you have a dog or cat and are allergic to dog or cats, consider removing the dog or cat from the home. If you have a dog or cat but don't want to find it a new home, or if your family wants a pet even though someone in the household is allergic, here are some strategies that may help keep symptoms at bay:  Keep the pet out of your bedroom and restrict it to only a few rooms. Be advised that keeping the dog or cat in only one room will not limit the allergens to that room. Don't pet, hug or kiss the dog or cat; if you do, wash your hands with soap and water. High-efficiency particulate air (HEPA) cleaners run continuously in a bedroom or living room can reduce allergen levels over time. Regular use of a high-efficiency vacuum cleaner or a central vacuum can reduce allergen levels. Giving your dog or cat a bath at least once a week can reduce airborne allergen.  Allergy Shots  Allergies are the result of a chain reaction that starts in the immune system. Your immune system controls how your body defends itself. For instance, if you have an allergy to pollen, your immune system identifies pollen as an invader or allergen. Your immune system overreacts by producing antibodies called Immunoglobulin E  (IgE). These antibodies travel to cells that release chemicals, causing an allergic reaction.  The concept behind allergy immunotherapy, whether it is received in the form of shots or tablets, is that the immune system can be desensitized to specific allergens that trigger allergy symptoms. Although it requires time and patience, the payback can be long-term relief. Allergy injections contain a dilute solution of those substances that you are allergic to based upon your skin testing and allergy history.   How Do Allergy Shots Work?  Allergy shots work much like a vaccine. Your body responds to injected amounts of a particular allergen given in increasing doses, eventually developing a resistance and tolerance to it. Allergy shots can lead to decreased, minimal or no allergy symptoms.  There generally are two phases: build-up and maintenance. Build-up often ranges from three to six months and involves receiving injections with increasing amounts of the allergens. The shots are typically given once or twice a week, though more rapid build-up schedules are sometimes used.  The maintenance phase begins when the most effective dose is reached. This dose is different for each person, depending on how allergic you are and your response to the build-up injections. Once the maintenance dose is reached, there are longer periods between injections, typically two to four weeks.  Occasionally doctors give cortisone-type shots that can temporarily reduce allergy symptoms. These types of shots are different and should not be confused with allergy immunotherapy shots.  Who Can Be Treated with Allergy Shots?  Allergy shots may be a good treatment approach for people with allergic rhinitis (hay fever), allergic asthma, conjunctivitis (eye allergy) or stinging insect allergy.   Before deciding to begin allergy shots, you should consider:   The length of allergy season and the severity of your symptoms  Whether  medications and/or changes to your environment can control your symptoms  Your desire to avoid long-term medication use  Time: allergy immunotherapy requires a major time commitment  Cost: may vary depending on your insurance coverage  Allergy shots for children age 73 and older are effective and often well tolerated. They might prevent the onset of new allergen sensitivities or the progression to asthma.  Allergy shots are not started on patients who are pregnant but can be continued on patients who become pregnant while receiving them. In some patients with other medical conditions or who take certain common medications, allergy shots may be of risk. It is important to mention other medications you talk to your allergist.   What are the two types of build-ups offered:   RUSH or Rapid Desensitization -- one day of injections lasting from 8:30-4:30pm, injections every 1 hour.  Approximately half of the build-up process is completed in that one day.  The following week, normal build-up is resumed, and this entails ~16 visits either weekly or twice weekly, until reaching your "maintenance dose" which is continued weekly until eventually getting spaced out to every month for a duration of 3 to 5 years. The regular build-up appointments are nurse visits where the injections are administered, followed by required monitoring for 30 minutes.    Traditional build-up -- weekly visits for 6 -12 months until reaching "maintenance dose", then  continue weekly until eventually spacing out to every 4 weeks as above. At these appointments, the injections are administered, followed by required monitoring for 30 minutes.     Either way is acceptable, and both are equally effective. With the rush protocol, the advantage is that less time is spent here for injections overall AND you would also reach maintenance dosing faster (which is when the clinical benefit starts to become more apparent). Not everyone is a  candidate for rapid desensitization.   IF we proceed with the RUSH protocol, there are premedications which must be taken the day before and the day after the rush only (this includes antihistamines, steroids, and Singulair).  After the rush day, no prednisone  or Singulair is required, and we just recommend antihistamines taken on your injection day.  What Is An Estimate of the Costs?  If you are interested in starting allergy injections, please check with your insurance company about your coverage for both allergy vial sets and allergy injections.  Please do so prior to making the appointment to start injections.  The following are CPT codes to give to your insurance company. These are the amounts we BILL to the insurance company, but the amount YOU WILL PAY and WE RECEIVE IS SUBSTANTIALLY LESS and depends on the contracts we have with different insurance companies.   Amount Billed to Insurance One allergy vial set  CPT 95165   $ 1200     Two allergy vial set  CPT 95165   $ 2400     Three allergy vial set  CPT 95165   $ 3600     One injection   CPT 95115   $ 35  Two injections   CPT 95117   $ 40 RUSH (Rapid Desensitization) CPT 95180 x 8 hours $500/hour  Regarding the allergy injections, your co-pay may or may not apply with each injection, so please confirm this with your insurance company. When you start allergy injections, 1 or 2 sets of vials are made based on your allergies.  Not all patients can be on one set of vials. A set of vials lasts 6 months to a year depending on how quickly you can proceed with your build-up of your allergy injections. Vials are personalized for each patient depending on their specific allergens.  How often are allergy injection given during the build-up period?   Injections are given at least weekly during the build-up period until your maintenance dose is achieved. Per the doctor's discretion, you may have the option of getting allergy injections two times per  week during the build-up period. However, there must be at least 48 hours between injections. The build-up period is usually completed within 6-12 months depending on your ability to schedule injections and for adjustments for reactions. When maintenance dose is reached, your injection schedule is gradually changed to every two weeks and later to every three weeks. Injections will then continue every 4 weeks. Usually, injections are continued for a total of 3-5 years.   When Will I Feel Better?  Some may experience decreased allergy symptoms during the build-up phase. For others, it may take as long as 12 months on the maintenance dose. If there is no improvement after a year of maintenance, your allergist will discuss other treatment options with you.  If you aren't responding to allergy shots, it may be because there is not enough dose of the allergen in your vaccine or there are missing allergens that were not identified during your allergy  testing. Other reasons could be that there are high levels of the allergen in your environment or major exposure to non-allergic triggers like tobacco smoke.  What Is the Length of Treatment?  Once the maintenance dose is reached, allergy shots are generally continued for three to five years. The decision to stop should be discussed with your allergist at that time. Some people may experience a permanent reduction of allergy symptoms. Others may relapse and a longer course of allergy shots can be considered.  What Are the Possible Reactions?  The two types of adverse reactions that can occur with allergy shots are local and systemic. Common local reactions include very mild redness and swelling at the injection site, which can happen immediately or several hours after. Report a delayed reaction from your last injection. These include arm swelling or runny nose, watery eyes or cough that occurs within 12-24 hours after injection. A systemic reaction, which is less  common, affects the entire body or a particular body system. They are usually mild and typically respond quickly to medications. Signs include increased allergy symptoms such as sneezing, a stuffy nose or hives.   Rarely, a serious systemic reaction called anaphylaxis can develop. Symptoms include swelling in the throat, wheezing, a feeling of tightness in the chest, nausea or dizziness. Most serious systemic reactions develop within 30 minutes of allergy shots. This is why it is strongly recommended you wait in your doctor's office for 30 minutes after your injections. Your allergist is trained to watch for reactions, and his or her staff is trained and equipped with the proper medications to identify and treat them.   Report to the nurse immediately if you experience any of the following symptoms: swelling, itching or redness of the skin, hives, watery eyes/nose, breathing difficulty, excessive sneezing, coughing, stomach pain, diarrhea, or light headedness. These symptoms may occur within 15-20 minutes after injection and may require medication.   Who Should Administer Allergy Shots?  The preferred location for receiving shots is your prescribing allergist's office. Injections can sometimes be given at another facility where the physician and staff are trained to recognize and treat reactions, and have received instructions by your prescribing allergist.  What if I am late for an injection?   Injection dose will be adjusted depending upon how many days or weeks you are late for your injection.   What if I am sick?   Please report any illness to the nurse before receiving injections. She may adjust your dose or postpone injections depending on your symptoms. If you have fever, flu, sinus infection or chest congestion it is best to postpone allergy injections until you are better. Never get an allergy injection if your asthma is causing you problems. If your symptoms persist, seek out medical care  to get your health problem under control.  What If I am or Become Pregnant:  Women that become pregnant should schedule an appointment with [facility] before receiving any further allergy injections.       Subjective:   Jennifer Velasquez is a 68 y.o. female presenting today for follow up of  Chief Complaint  Patient presents with   Allergy Testing    AVERILL WINTERS has a history of the following: Patient Active Problem List   Diagnosis Date Noted   Osteopenia 03/11/2024   Urticaria 01/26/2024   Colon cancer screening 09/04/2023   Dysuria 03/13/2023   Right hand pain 03/13/2023   Left hip pain 03/13/2023  Encounter for well adult exam with abnormal findings 03/13/2023   Need for zoster vaccination 09/03/2022   Rash and nonspecific skin eruption 08/16/2022   Atopic dermatitis 12/13/2021   Immunization due 03/29/2021   Hemorrhoid 03/29/2021   Annual visit for general adult medical examination with abnormal findings 07/20/2020   Post-menopausal 07/20/2020   Encounter for screening for malignant neoplasm of colon 07/20/2020   Carpal tunnel syndrome on right 07/20/2020   Pap smear of cervix declined 07/20/2020   Need for immunization against influenza 07/20/2020   Vitamin D  deficiency 12/01/2011   Lumbago 07/20/2007   Type 2 diabetes mellitus with hyperglycemia (HCC) 01/07/2007   Hyperlipidemia associated with type 2 diabetes mellitus (HCC) 09/11/2006   Overweight (BMI 25.0-29.9) 09/11/2006   Essential hypertension 09/11/2006    History obtained from: chart review and {Persons; PED relatives w/patient:19415::patient}.  Discussed the use of AI scribe software for clinical note transcription with the patient and/or guardian, who gave verbal consent to proceed.  Jennifer Velasquez is a 68 y.o. female presenting for skin testing. She was last seen on ***. We could not do testing because her insurance company does not cover testing on the same day as a New Patient visit. She  has been off of all antihistamines 3 days in anticipation of the testing.     Otherwise, there have been no changes to her past medical history, surgical history, family history, or social history.    Review of systems otherwise negative other than that mentioned in the HPI.    Objective:   There were no vitals taken for this visit. There is no height or weight on file to calculate BMI.    Physical exam deferred since this was a skin testing appointment only.   Diagnostic studies: {Blank single:19197::none,deferred due to recent antihistamine use,labs sent instead, }  Spirometry: {Blank single:19197::results normal (FEV1: ***%, FVC: ***%, FEV1/FVC: ***%),results abnormal (FEV1: ***%, FVC: ***%, FEV1/FVC: ***%)}.    {Blank single:19197::Spirometry consistent with mild obstructive disease,Spirometry consistent with moderate obstructive disease,Spirometry consistent with severe obstructive disease,Spirometry consistent with possible restrictive disease,Spirometry consistent with mixed obstructive and restrictive disease,Spirometry uninterpretable due to technique,Spirometry consistent with normal pattern}. {Blank single:19197::Albuterol/Atrovent nebulizer,Xopenex/Atrovent nebulizer,Albuterol nebulizer,Albuterol four puffs via MDI,Xopenex four puffs via MDI} treatment given in clinic with {Blank single:19197::significant improvement in FEV1 per ATS criteria,significant improvement in FVC per ATS criteria,significant improvement in FEV1 and FVC per ATS criteria,improvement in FEV1, but not significant per ATS criteria,improvement in FVC, but not significant per ATS criteria,improvement in FEV1 and FVC, but not significant per ATS criteria,no improvement}.  Allergy Studies: {Blank single:19197::none,labs sent instead, }   Airborne Adult Perc - 04/09/24 1026     Time Antigen Placed 1026    Allergen Manufacturer Greer    Location Back     Number of Test 55    1. Control-Buffer 50% Glycerol Negative    2. Control-Histamine 2+    3. Bahia Negative    4. [place] Negative    5. Johnson Negative    6. [place]  Blue Negative    7. Meadow Fescue Negative    8. Perennial Rye Negative    9. Timothy Negative    10. Ragweed Mix Negative    11. Cocklebur Negative    12. Plantain,  English Negative    13. Baccharis Negative    14. Dog Fennel Negative    15. Russian Thistle Negative    16. Lamb's Quarters Negative    17. Sheep Sorrell Negative    18. Rough Pigweed  Negative    19. Marsh Elder, Rough Negative    20. Mugwort, Common Negative    21. Box, Elder Negative    22. Cedar, red Negative    23. Sweet Gum Negative    24. Pecan Pollen Negative    25. Pine Mix Negative    26. Walnut, Black Pollen Negative    27. Red Mulberry Negative    28. Ash Mix Negative    29. Birch Mix Negative    30. Beech American Negative    31. Cottonwood, [place] Negative    32. Hickory, White Negative    33. Maple Mix Negative    34. Oak, [place] Mix Negative    35. Sycamore Eastern Negative    36. Alternaria Alternata Negative    37. Cladosporium Herbarum Negative    38. Aspergillus Mix Negative    39. Penicillium Mix Negative    40. Bipolaris Sorokiniana (Helminthosporium) Negative    41. Drechslera Spicifera (Curvularia) Negative    42. Mucor Plumbeus Negative    43. Fusarium Moniliforme Negative    44. Aureobasidium Pullulans (pullulara) Negative    45. Rhizopus Oryzae Negative    46. Botrytis Cinera Negative    47. Epicoccum Nigrum Negative    48. Phoma Betae Negative    49. Dust Mite Mix Negative    50. Cat Hair 10,000 BAU/ml Negative    51.  Dog Epithelia Negative    52. Mixed Feathers Negative    53. Horse Epithelia Negative    54. Cockroach, German Negative    55. Tobacco Leaf Negative          13 Food Perc - 04/09/24 1027       Test Information   Time Antigen Placed 1027    Allergen Manufacturer Jestine     Location Back    Number of allergen test 13      Food   1. Peanut Negative    2. Soybean Negative    3. Wheat Negative    4. Sesame Negative    5. Milk, Cow Negative    6. Casein Negative    7. Egg White, Chicken Negative    8. Shellfish Mix Negative    9. Fish Mix Negative    10. Cashew Negative    11. Walnut Food Negative    12. Almond Negative    13. Hazelnut Negative          Intradermal - 04/09/24 1100     Time Antigen Placed 1100    Allergen Manufacturer Greer    Location Arm    Number of Test 16    Control Negative    Bahia 2+    [place] 2+    Johnson Negative    7 Grass Negative    Ragweed Mix Negative    Weed Mix Negative    Tree Mix 2+    Mold 1 Negative    Mold 2 Negative    Mold 3 Negative    Mold 4 1+    Mite Mix Negative    Cat 2+    Dog 2+    Cockroach Negative          {Blank single:19197::Allergy testing results were read and interpreted by myself, documented by clinical staff., }      Marty Shaggy, MD  [facility] 

## 2024-04-09 NOTE — Patient Instructions (Addendum)
 1. Pruritus and urticaria - with elevated ANA (screen for autoimmune disease) - We are going to refer you to see Rheumatology since your ANA was so elevated. - They will look into things like Rheumatoid Arthritis and lupus and whatnot.  - Chronic hives are often times a self limited process and will burn themselves out over 6-12 months, although this is not always the case.  - In the meantime, continue with suppressive dosing of antihistamines to keep the itching at bay:   - Morning: Xyzal (levocetirizine) 5 mg + Pepcid (famotidine) 20mg   - Evening: Zyrtec (cetirizine) 10mg  + Pepcid (famotidine) 20mg  - You can change this dosing at home, decreasing the dose as needed or increasing the dosing as needed.  - If you are not tolerating the medications or are tired of taking them every day, we can start treatment with a monthly injectable medication called Xolair.   2. Allergic rhinoconjunctivitis  - Testing today showed: grasses, trees, indoor molds, cat, and dog - Copy of test results provided.  - Avoidance measures provided. - Continue with: all of your antihistamines, as listed above for your hives  - You can use an extra dose of the antihistamine, if needed, for breakthrough symptoms.  - Consider nasal saline rinses 1-2 times daily to remove allergens from the nasal cavities as well as help with mucous clearance (this is especially helpful to do before the nasal sprays are given) - Consider allergy shots as a means of long-term control. - Allergy shots re-train and reset the immune system to ignore environmental allergens and decrease the resulting immune response to those allergens (sneezing, itchy watery eyes, runny nose, nasal congestion, etc).    - Allergy shots improve symptoms in 75-85% of patients.  - We can discuss more at the next appointment if the medications are not working for you.  3. Return in about 3 months (around 07/10/2024). You can have the follow up appointment with Dr.  Iva or a Nurse Practicioner (our Nurse Practitioners are excellent and always have Physician oversight!).    Please inform us  of any Emergency Department visits, hospitalizations, or changes in symptoms. Call us  before going to the ED for breathing or allergy symptoms since we might be able to fit you in for a sick visit. Feel free to contact us  anytime with any questions, problems, or concerns.  It was a pleasure to meet you today!  Websites that have reliable patient information: 1. American Academy of Asthma, Allergy, and Immunology: www.aaaai.org 2. Food Allergy Research and Education (FARE): foodallergy.org 3. Mothers of Asthmatics: http://www.asthmacommunitynetwork.org 4. American College of Allergy, Asthma, and Immunology: www.acaai.org      "Like" us  on Facebook and Instagram for our latest updates!      A healthy democracy works best when [employer] participate! Make sure you are registered to vote! If you have moved or changed any of your contact information, you will need to get this updated before voting! Scan the QR codes below to learn more!        Airborne Adult Perc - 04/09/24 1026     Time Antigen Placed 1026    Allergen Manufacturer Jestine    Location Back    Number of Test 55    1. Control-Buffer 50% Glycerol Negative    2. Control-Histamine 2+    3. Bahia Negative    4. [place] Negative    5. Johnson Negative    6. [place]  Blue Negative    7. Meadow Fescue Negative  8. Perennial Rye Negative    9. Timothy Negative    10. Ragweed Mix Negative    11. Cocklebur Negative    12. Plantain,  English Negative    13. Baccharis Negative    14. Dog Fennel Negative    15. Russian Thistle Negative    16. Lamb's Quarters Negative    17. Sheep Sorrell Negative    18. Rough Pigweed Negative    19. Marsh Elder, Rough Negative    20. Mugwort, Common Negative    21. Box, Elder Negative    22. Cedar, red Negative    23. Sweet Gum Negative    24. Pecan  Pollen Negative    25. Pine Mix Negative    26. Walnut, Black Pollen Negative    27. Red Mulberry Negative    28. Ash Mix Negative    29. Birch Mix Negative    30. Beech American Negative    31. Cottonwood, Guinea[place]issau Negative    32. Hickory, White Negative    33. Maple Mix Negative    34. Oak, [place] Mix Negative    35. Sycamore Eastern Negative    36. Alternaria Alternata Negative    37. Cladosporium Herbarum Negative    38. Aspergillus Mix Negative    39. Penicillium Mix Negative    40. Bipolaris Sorokiniana (Helminthosporium) Negative    41. Drechslera Spicifera (Curvularia) Negative    42. Mucor Plumbeus Negative    43. Fusarium Moniliforme Negative    44. Aureobasidium Pullulans (pullulara) Negative    45. Rhizopus Oryzae Negative    46. Botrytis Cinera Negative    47. Epicoccum Nigrum Negative    48. Phoma Betae Negative    49. Dust Mite Mix Negative    50. Cat Hair 10,000 BAU/ml Negative    51.  Dog Epithelia Negative    52. Mixed Feathers Negative    53. Horse Epithelia Negative    54. Cockroach, German Negative    55. Tobacco Leaf Negative          13 Food Perc - 04/09/24 1027       Test Information   Time Antigen Placed 1027    Allergen Manufacturer Jestine    Location Back    Number of allergen test 13      Food   1. Peanut Negative    2. Soybean Negative    3. Wheat Negative    4. Sesame Negative    5. Milk, Cow Negative    6. Casein Negative    7. Egg White, Chicken Negative    8. Shellfish Mix Negative    9. Fish Mix Negative    10. Cashew Negative    11. Walnut Food Negative    12. Almond Negative    13. Hazelnut Negative          Intradermal - 04/09/24 1100     Time Antigen Placed 1100    Allergen Manufacturer Greer    Location Arm    Number of Test 16    Control Negative    Bahia 2+    [place] 2+    Johnson Negative    7 Grass Negative    Ragweed Mix Negative    Weed Mix Negative    Tree Mix 2+    Mold 1 Negative    Mold 2  Negative    Mold 3 Negative    Mold 4 1+    Mite Mix Negative    Cat 2+  Dog 2+    Cockroach Negative          Reducing Pollen Exposure  The American Academy of Allergy, Asthma and Immunology suggests the following steps to reduce your exposure to pollen during allergy seasons.    Do not hang sheets or clothing out to dry; pollen may collect on these items. Do not mow lawns or spend time around freshly cut grass; mowing stirs up pollen. Keep windows closed at night.  Keep car windows closed while driving. Minimize morning activities outdoors, a time when pollen counts are usually at their highest. Stay indoors as much as possible when pollen counts or humidity is high and on windy days when pollen tends to remain in the air longer. Use air conditioning when possible.  Many air conditioners have filters that trap the pollen spores. Use a HEPA room air filter to remove pollen form the indoor air you breathe.  Control of Mold Allergen   Mold and fungi can grow on a variety of surfaces provided certain temperature and moisture conditions exist.  Outdoor molds grow on plants, decaying vegetation and soil.  The major outdoor mold, Alternaria and Cladosporium, are found in very high numbers during hot and dry conditions.  Generally, a late Summer - Fall peak is seen for common outdoor fungal spores.  Rain will temporarily lower outdoor mold spore count, but counts rise rapidly when the rainy period ends.  The most important indoor molds are Aspergillus and Penicillium.  Dark, humid and poorly ventilated basements are ideal sites for mold growth.  The next most common sites of mold growth are the bathroom and the kitchen.   Indoor (Perennial) Mold Control   Positive indoor molds via skin testing: Fusarium, Aureobasidium (Pullulara), and Rhizopus  Maintain humidity below 50%. Clean washable surfaces with 5% bleach solution. Remove sources e.g. contaminated carpets.    Control of Dog  or Cat Allergen  Avoidance is the best way to manage a dog or cat allergy. If you have a dog or cat and are allergic to dog or cats, consider removing the dog or cat from the home. If you have a dog or cat but don't want to find it a new home, or if your family wants a pet even though someone in the household is allergic, here are some strategies that may help keep symptoms at bay:  Keep the pet out of your bedroom and restrict it to only a few rooms. Be advised that keeping the dog or cat in only one room will not limit the allergens to that room. Don't pet, hug or kiss the dog or cat; if you do, wash your hands with soap and water. High-efficiency particulate air (HEPA) cleaners run continuously in a bedroom or living room can reduce allergen levels over time. Regular use of a high-efficiency vacuum cleaner or a central vacuum can reduce allergen levels. Giving your dog or cat a bath at least once a week can reduce airborne allergen.  Allergy Shots  Allergies are the result of a chain reaction that starts in the immune system. Your immune system controls how your body defends itself. For instance, if you have an allergy to pollen, your immune system identifies pollen as an invader or allergen. Your immune system overreacts by producing antibodies called Immunoglobulin E (IgE). These antibodies travel to cells that release chemicals, causing an allergic reaction.  The concept behind allergy immunotherapy, whether it is received in the form of shots or tablets, is that the immune  system can be desensitized to specific allergens that trigger allergy symptoms. Although it requires time and patience, the payback can be long-term relief. Allergy injections contain a dilute solution of those substances that you are allergic to based upon your skin testing and allergy history.   How Do Allergy Shots Work?  Allergy shots work much like a vaccine. Your body responds to injected amounts of a particular  allergen given in increasing doses, eventually developing a resistance and tolerance to it. Allergy shots can lead to decreased, minimal or no allergy symptoms.  There generally are two phases: build-up and maintenance. Build-up often ranges from three to six months and involves receiving injections with increasing amounts of the allergens. The shots are typically given once or twice a week, though more rapid build-up schedules are sometimes used.  The maintenance phase begins when the most effective dose is reached. This dose is different for each person, depending on how allergic you are and your response to the build-up injections. Once the maintenance dose is reached, there are longer periods between injections, typically two to four weeks.  Occasionally doctors give cortisone-type shots that can temporarily reduce allergy symptoms. These types of shots are different and should not be confused with allergy immunotherapy shots.  Who Can Be Treated with Allergy Shots?  Allergy shots may be a good treatment approach for people with allergic rhinitis (hay fever), allergic asthma, conjunctivitis (eye allergy) or stinging insect allergy.   Before deciding to begin allergy shots, you should consider:   The length of allergy season and the severity of your symptoms  Whether medications and/or changes to your environment can control your symptoms  Your desire to avoid long-term medication use  Time: allergy immunotherapy requires a major time commitment  Cost: may vary depending on your insurance coverage  Allergy shots for children age 19 and older are effective and often well tolerated. They might prevent the onset of new allergen sensitivities or the progression to asthma.  Allergy shots are not started on patients who are pregnant but can be continued on patients who become pregnant while receiving them. In some patients with other medical conditions or who take certain common medications,  allergy shots may be of risk. It is important to mention other medications you talk to your allergist.   What are the two types of build-ups offered:   RUSH or Rapid Desensitization -- one day of injections lasting from 8:30-4:30pm, injections every 1 hour.  Approximately half of the build-up process is completed in that one day.  The following week, normal build-up is resumed, and this entails ~16 visits either weekly or twice weekly, until reaching your "maintenance dose" which is continued weekly until eventually getting spaced out to every month for a duration of 3 to 5 years. The regular build-up appointments are nurse visits where the injections are administered, followed by required monitoring for 30 minutes.    Traditional build-up -- weekly visits for 6 -12 months until reaching "maintenance dose", then continue weekly until eventually spacing out to every 4 weeks as above. At these appointments, the injections are administered, followed by required monitoring for 30 minutes.     Either way is acceptable, and both are equally effective. With the rush protocol, the advantage is that less time is spent here for injections overall AND you would also reach maintenance dosing faster (which is when the clinical benefit starts to become more apparent). Not everyone is a candidate for rapid desensitization.   IF we  proceed with the RUSH protocol, there are premedications which must be taken the day before and the day after the rush only (this includes antihistamines, steroids, and Singulair).  After the rush day, no prednisone  or Singulair is required, and we just recommend antihistamines taken on your injection day.  What Is An Estimate of the Costs?  If you are interested in starting allergy injections, please check with your insurance company about your coverage for both allergy vial sets and allergy injections.  Please do so prior to making the appointment to start injections.  The following are  CPT codes to give to your insurance company. These are the amounts we BILL to the insurance company, but the amount YOU WILL PAY and WE RECEIVE IS SUBSTANTIALLY LESS and depends on the contracts we have with different insurance companies.   Amount Billed to Insurance One allergy vial set  CPT 95165   $ 1200     Two allergy vial set  CPT 95165   $ 2400     Three allergy vial set  CPT 95165   $ 3600     One injection   CPT 95115   $ 35  Two injections   CPT 95117   $ 40 RUSH (Rapid Desensitization) CPT 95180 x 8 hours $500/hour  Regarding the allergy injections, your co-pay may or may not apply with each injection, so please confirm this with your insurance company. When you start allergy injections, 1 or 2 sets of vials are made based on your allergies.  Not all patients can be on one set of vials. A set of vials lasts 6 months to a year depending on how quickly you can proceed with your build-up of your allergy injections. Vials are personalized for each patient depending on their specific allergens.  How often are allergy injection given during the build-up period?   Injections are given at least weekly during the build-up period until your maintenance dose is achieved. Per the doctor's discretion, you may have the option of getting allergy injections two times per week during the build-up period. However, there must be at least 48 hours between injections. The build-up period is usually completed within 6-12 months depending on your ability to schedule injections and for adjustments for reactions. When maintenance dose is reached, your injection schedule is gradually changed to every two weeks and later to every three weeks. Injections will then continue every 4 weeks. Usually, injections are continued for a total of 3-5 years.   When Will I Feel Better?  Some may experience decreased allergy symptoms during the build-up phase. For others, it may take as long as 12 months on the maintenance dose.  If there is no improvement after a year of maintenance, your allergist will discuss other treatment options with you.  If you aren't responding to allergy shots, it may be because there is not enough dose of the allergen in your vaccine or there are missing allergens that were not identified during your allergy testing. Other reasons could be that there are high levels of the allergen in your environment or major exposure to non-allergic triggers like tobacco smoke.  What Is the Length of Treatment?  Once the maintenance dose is reached, allergy shots are generally continued for three to five years. The decision to stop should be discussed with your allergist at that time. Some people may experience a permanent reduction of allergy symptoms. Others may relapse and a longer course of allergy shots can be considered.  What Are the Possible Reactions?  The two types of adverse reactions that can occur with allergy shots are local and systemic. Common local reactions include very mild redness and swelling at the injection site, which can happen immediately or several hours after. Report a delayed reaction from your last injection. These include arm swelling or runny nose, watery eyes or cough that occurs within 12-24 hours after injection. A systemic reaction, which is less common, affects the entire body or a particular body system. They are usually mild and typically respond quickly to medications. Signs include increased allergy symptoms such as sneezing, a stuffy nose or hives.   Rarely, a serious systemic reaction called anaphylaxis can develop. Symptoms include swelling in the throat, wheezing, a feeling of tightness in the chest, nausea or dizziness. Most serious systemic reactions develop within 30 minutes of allergy shots. This is why it is strongly recommended you wait in your doctor's office for 30 minutes after your injections. Your allergist is trained to watch for reactions, and his or her  staff is trained and equipped with the proper medications to identify and treat them.   Report to the nurse immediately if you experience any of the following symptoms: swelling, itching or redness of the skin, hives, watery eyes/nose, breathing difficulty, excessive sneezing, coughing, stomach pain, diarrhea, or light headedness. These symptoms may occur within 15-20 minutes after injection and may require medication.   Who Should Administer Allergy Shots?  The preferred location for receiving shots is your prescribing allergist's office. Injections can sometimes be given at another facility where the physician and staff are trained to recognize and treat reactions, and have received instructions by your prescribing allergist.  What if I am late for an injection?   Injection dose will be adjusted depending upon how many days or weeks you are late for your injection.   What if I am sick?   Please report any illness to the nurse before receiving injections. She may adjust your dose or postpone injections depending on your symptoms. If you have fever, flu, sinus infection or chest congestion it is best to postpone allergy injections until you are better. Never get an allergy injection if your asthma is causing you problems. If your symptoms persist, seek out medical care to get your health problem under control.  What If I am or Become Pregnant:  Women that become pregnant should schedule an appointment with [facility] before receiving any further allergy injections.

## 2024-04-12 ENCOUNTER — Encounter: Payer: Self-pay | Admitting: Allergy & Immunology

## 2024-05-25 DIAGNOSIS — M25572 Pain in left ankle and joints of left foot: Secondary | ICD-10-CM | POA: Diagnosis not present

## 2024-06-08 LAB — LAB REPORT - SCANNED
A1c: 8.6
EGFR: 90
Microalb Creat Ratio: 30

## 2024-06-14 ENCOUNTER — Other Ambulatory Visit: Payer: Self-pay | Admitting: Internal Medicine

## 2024-07-11 DIAGNOSIS — L209 Atopic dermatitis, unspecified: Secondary | ICD-10-CM | POA: Diagnosis not present

## 2024-07-11 DIAGNOSIS — L508 Other urticaria: Secondary | ICD-10-CM | POA: Diagnosis not present

## 2024-07-21 NOTE — Progress Notes (Signed)
 GAE BIHL                                          MRN: [id number]   07/21/2024   The [facility] Quality Team Specialist reviewed this patient medical record for the purposes of chart review for care gap closure. The following were reviewed: abstraction for care gap closure-glycemic status assessment.Jennifer Velasquez                                          MRN: [id number]   07/23/2024   The [facility] Quality Team Specialist reviewed this patient medical record for the purposes of chart review for care gap closure. The following were reviewed: chart review for care gap closure-kidney health evaluation for diabetes:eGFR  and uACR.    [facility] Quality Team

## 2024-07-30 ENCOUNTER — Encounter: Payer: Self-pay | Admitting: Family Medicine

## 2024-07-30 ENCOUNTER — Ambulatory Visit: Payer: Self-pay

## 2024-07-30 ENCOUNTER — Ambulatory Visit: Admitting: Family Medicine

## 2024-07-30 VITALS — BP 130/80 | HR 71 | Temp 97.1°F | Resp 18 | Ht 61.81 in | Wt 151.8 lb

## 2024-07-30 DIAGNOSIS — H6993 Unspecified Eustachian tube disorder, bilateral: Secondary | ICD-10-CM | POA: Diagnosis not present

## 2024-07-30 DIAGNOSIS — R7689 Other specified abnormal immunological findings in serum: Secondary | ICD-10-CM | POA: Insufficient documentation

## 2024-07-30 DIAGNOSIS — J302 Other seasonal allergic rhinitis: Secondary | ICD-10-CM | POA: Diagnosis not present

## 2024-07-30 DIAGNOSIS — Z23 Encounter for immunization: Secondary | ICD-10-CM | POA: Diagnosis not present

## 2024-07-30 DIAGNOSIS — J3089 Other allergic rhinitis: Secondary | ICD-10-CM

## 2024-07-30 DIAGNOSIS — L508 Other urticaria: Secondary | ICD-10-CM | POA: Diagnosis not present

## 2024-07-30 MED ORDER — FAMOTIDINE 20 MG PO TABS: 20.0000 mg | ORAL_TABLET | Freq: Two times a day (BID) | ORAL | 5 refills | Status: AC

## 2024-07-30 NOTE — Patient Instructions (Addendum)
 Allergic rhinitis Continue allergen avoidance measures directed toward grass pollen, tree pollen, indoor mold, cat, and dog as listed below Continue an antihistamine once or twice a day if needed for runny nose or itch Add Flonase nasal spray 2 sprays in each nostril once a day for a stuffy nose.  In the right nostril, point the applicator out toward the right ear. In the left nostril, point the applicator out toward the left ear Consider saline nasal rinses as needed for nasal symptoms. Use this before any medicated nasal sprays for best result Consider allergen immunotherapy if your symptoms are not well-controlled with the treatment plan as listed above  Eustachian tube dysfunction Begin nasal saline rinses Begin Flonase as listed above  Chronic urticaria/pruritus In the morning-Xyzal 5 mg and ADD famotidine 20 mg In the evening-cetirizine 10 mg and ADD famotidine 20 mg Continue triamcinolone  to red and itchy areas underneath your face up to twice a day if needed Consider Xolair injections for control of urticaria  Elevated ANA Continue to follow up with your rheumatology specialist as recommended  Call the clinic if this treatment plan is not working well for you.  Follow up in 3 months or sooner if needed.  Reducing Pollen Exposure The American Academy of [employer] , [employer] suggests the following steps to reduce your exposure to pollen during allergy  seasons. Do not hang sheets or clothing out to dry; pollen may collect on these items. Do not mow lawns or spend time around freshly cut grass; mowing stirs up pollen. Keep windows closed at night.  Keep car windows closed while driving. Minimize morning activities outdoors, a time when pollen counts are usually at their highest. Stay indoors as much as possible when pollen counts or humidity is high and on windy days when pollen tends to remain in the air longer. Use air conditioning when possible.  Many air conditioners  have filters that trap the pollen spores. Use a HEPA room air filter to remove pollen form the indoor air you breathe.  Control of Mold Allergen Mold and fungi can grow on a variety of surfaces provided certain temperature and moisture conditions exist.  Outdoor molds grow on plants, decaying vegetation and soil.  The major outdoor mold, Alternaria and Cladosporium, are found in very high numbers during hot and dry conditions.  Generally, a late Summer - Fall peak is seen for common outdoor fungal spores.  Rain will temporarily lower outdoor mold spore count, but counts rise rapidly when the rainy period ends.  The most important indoor molds are Aspergillus and Penicillium.  Dark, humid and poorly ventilated basements are ideal sites for mold growth.  The next most common sites of mold growth are the bathroom and the kitchen.  Outdoor [employer] Use air conditioning and keep windows closed Avoid exposure to decaying vegetation. Avoid leaf raking. Avoid grain handling. Consider wearing a face mask if working in moldy areas.  Indoor Mold Control Maintain humidity below 50%. Clean washable surfaces with 5% bleach solution. Remove sources e.g. Contaminated carpets.  Control of Dog or Cat Allergen Avoidance is the best way to manage a dog or cat allergy . If you have a dog or cat and are allergic to dog or cats, consider removing the dog or cat from the home. If you have a dog or cat but don't want to find it a new home, or if your family wants a pet even though someone in the household is allergic, here are some strategies that may  help keep symptoms at bay:  Keep the pet out of your bedroom and restrict it to only a few rooms. Be advised that keeping the dog or cat in only one room will not limit the allergens to that room. Don't pet, hug or kiss the dog or cat; if you do, wash your hands with soap and water. High-efficiency particulate air (HEPA) cleaners run continuously in a bedroom or  living room can reduce allergen levels over time. Regular use of a high-efficiency vacuum cleaner or a central vacuum can reduce allergen levels. Giving your dog or cat a bath at least once a week can reduce airborne allergen.

## 2024-07-30 NOTE — Progress Notes (Signed)
 [address] AZALEA LUBA Velasquez [place] [address] Dept: [phone]  FOLLOW UP NOTE  Patient ID: Jennifer Velasquez, female    DOB: September 12, 1956  Age: 68 y.o. MRN: [id number] Date of Office Visit: 07/30/2024  Assessment  Chief Complaint: Follow-up Jennifer Velasquez to urgent care for hives)  HPI Jennifer Velasquez is a 68 year old female who presents to the clinic for follow-up visit.  She was last seen in this clinic on 04/09/2024 by Dr. Iva for evaluation of allergic rhinitis, chronic urticaria, and pruritus.  Discussed the use of AI scribe software for clinical note transcription with the patient, who gave verbal consent to proceed.  History of Present Illness Jennifer Velasquez is a 68 year old female who presents with recurrent hives and possible medication interactions.  She has been experiencing recurrent hives for the past two years, with episodes occurring at least four times a year. Initially, the hives appeared on her legs and have since spread to her arms, accompanied by burning and itching sensations. She is currently using triamcinolone  cream prescribed during one of her urgent care visits. She reports beginning Jardiance  about 2 years ago as well as receiving a COVID vaccine around that time. Otherwise, she denies any new products, medications, foods, or insect bites. She denies recent fever, sweats, chills or sick contacts. She recalls one instance where she felt her throat might be closing but has not experienced any abdominal pain, diarrhea, or vomiting associated with the hives. Lab work up for urticaria was largely negative with normal results including alpha gal panel, thyroid  antibodies, tryptase, sedimentation rate, and C-reactive protein. At that time, ANA was positive with positive FANA staining pattern. She has a new patient appointment on 10/29/2023 with Dr. Dolphus, rheumatology specialist.  She continues cetirizine 10 mg twice a day and is not currently taking famotidine. We discussed  montelukast as a possible additional medication to suppress hives as well as Xolair injections for hive control. She is interested in trying famotidine before moving forward with montelukast or Xolair.   She has not experienced typical allergy  symptoms such as runny nose or watery eyes but does report intermittent occasional ear popping. She is not currently using and nasal saline rinses or nasal steroid spray.  Her last environmental allergy  skin testing on 04/09/2024 was positive to grass pollen, tree pollen, indoor mold, cat, and dog.   Her current medications are listed in the chart.  Drug Allergies:  Allergies  Allergen Reactions   Pravastatin Sodium     REACTION: sore throat    Physical Exam: BP 130/80 (BP Location: Left Arm, Patient Position: Sitting, Cuff Size: Normal)   Pulse 71   Temp (!) 97.1 F (36.2 C) (Temporal)   Resp 18   Ht 5' 1.81 (1.57 m)   Wt 151 lb 12.8 oz (68.9 kg)   SpO2 99%   BMI 27.93 kg/m    Physical Exam Vitals reviewed.  Constitutional:      Appearance: Normal appearance.  HENT:     Head: Normocephalic and atraumatic.     Right Ear: Tympanic membrane normal.     Left Ear: Tympanic membrane normal.     Nose:     Comments: Bilateral nares slightly erythematous with thin clear nasal drainage noted. Pharynx normal. Ears normal. Eyes normal.    Mouth/Throat:     Pharynx: Oropharynx is clear.  Eyes:     Conjunctiva/sclera: Conjunctivae normal.  Cardiovascular:     Rate and Rhythm: Normal rate and regular rhythm.  Heart sounds: Normal heart sounds. No murmur heard. Pulmonary:     Effort: Pulmonary effort is normal.     Breath sounds: Normal breath sounds.     Comments: Lungs clear to ausculation Musculoskeletal:        General: Normal range of motion.     Cervical back: Normal range of motion and neck supple.  Skin:    General: Skin is warm and dry.  Neurological:     Mental Status: She is alert and oriented to person, place, and time.   Psychiatric:        Mood and Affect: Mood normal.        Behavior: Behavior normal.        Thought Content: Thought content normal.        Judgment: Judgment normal.    Assessment and Plan: 1. Seasonal and perennial allergic rhinitis   2. Chronic urticaria   3. Elevated antinuclear antibody (ANA) level   4. Dysfunction of both eustachian tubes     Meds ordered this encounter  Medications   famotidine (PEPCID) 20 MG tablet    Sig: Take 1 tablet (20 mg total) by mouth 2 (two) times daily.    Dispense:  60 tablet    Refill:  5    Patient Instructions  Allergic rhinitis Continue allergen avoidance measures directed toward grass pollen, tree pollen, indoor mold, cat, and dog as listed below Continue an antihistamine once or twice a day if needed for runny nose or itch Add Flonase nasal spray 2 sprays in each nostril once a day for a stuffy nose.  In the right nostril, point the applicator out toward the right ear. In the left nostril, point the applicator out toward the left ear Consider saline nasal rinses as needed for nasal symptoms. Use this before any medicated nasal sprays for best result Consider allergen immunotherapy if your symptoms are not well-controlled with the treatment plan as listed above  Eustachian tube dysfunction Begin nasal saline rinses Begin Flonase as listed above  Chronic urticaria/pruritus In the morning-Xyzal 5 mg and ADD famotidine 20 mg In the evening-cetirizine 10 mg and ADD famotidine 20 mg Continue triamcinolone  to red and itchy areas underneath your face up to twice a day if needed Consider Xolair injections for control of urticaria  Elevated ANA Continue to follow up with your rheumatology specialist as recommended  Call the clinic if this treatment plan is not working well for you.  Follow up in 3 months or sooner if needed.  Return in about 3 months (around 10/30/2024), or if symptoms worsen or fail to improve.    Thank you for the  opportunity to care for this patient.  Please do not hesitate to contact me with questions.  Arlean Mutter, FNP [facility]  

## 2024-08-06 ENCOUNTER — Ambulatory Visit (INDEPENDENT_AMBULATORY_CARE_PROVIDER_SITE_OTHER): Payer: Self-pay

## 2024-08-06 VITALS — BP 165/83 | HR 90 | Ht 62.0 in | Wt 152.1 lb

## 2024-08-06 DIAGNOSIS — B351 Tinea unguium: Secondary | ICD-10-CM

## 2024-08-06 DIAGNOSIS — Z1231 Encounter for screening mammogram for malignant neoplasm of breast: Secondary | ICD-10-CM

## 2024-08-06 MED ORDER — KETOCONAZOLE 2 % EX CREA: 1.0000 | TOPICAL_CREAM | Freq: Two times a day (BID) | CUTANEOUS | 2 refills | Status: DC

## 2024-08-06 NOTE — Progress Notes (Signed)
 Established Patient Office Visit  Subjective   Patient ID: Jennifer Velasquez, female    DOB: 1956/02/06  Age: 68 y.o. MRN: [id number]  Chief Complaint  Patient presents with   Medical Management of Chronic Issues    Left Brest lump also a rash under breast as well     HPI Discussed the use of AI scribe software for clinical note transcription with the patient, who gave verbal consent to proceed.  History of Present Illness   Jennifer Velasquez is a 68 year old female who presents with a rash and a new bump under her breast.  Intertriginous rash - Rash located under the breast present for approximately two years - Intermittent improvement followed by recurrence - No effective response to Vaseline, talcum powder, or a cream prescribed for a different condition - Describes her skin as problematic  Submammary nodule - New bump or knot under the breast first noticed a few days ago - Initially expected spontaneous resolution, but the lesion has persisted  Vaginal dryness - Significant vaginal dryness attributed to aging and menopause - Previously used an estrogen pill, discontinued after her daughter was diagnosed with breast cancer      Patient Active Problem List   Diagnosis Date Noted   Tinea unguium 08/08/2024   Elevated antinuclear antibody (ANA) level 07/30/2024   Seasonal and perennial allergic rhinitis 07/30/2024   Dysfunction of both eustachian tubes 07/30/2024   Osteopenia 03/11/2024   Chronic urticaria 01/26/2024   Colon cancer screening 09/04/2023   Dysuria 03/13/2023   Right hand pain 03/13/2023   Left hip pain 03/13/2023   Encounter for well adult exam with abnormal findings 03/13/2023   Need for zoster vaccination 09/03/2022   Rash and nonspecific skin eruption 08/16/2022   Atopic dermatitis 12/13/2021   Immunization due 03/29/2021   Hemorrhoid 03/29/2021   Annual visit for general adult medical examination with abnormal findings 07/20/2020   Post-menopausal  07/20/2020   Encounter for screening for malignant neoplasm of colon 07/20/2020   Carpal tunnel syndrome on right 07/20/2020   Pap smear of cervix declined 07/20/2020   Need for immunization against influenza 07/20/2020   Vitamin D  deficiency 12/01/2011   Lumbago 07/20/2007   Type 2 diabetes mellitus with hyperglycemia (HCC) 01/07/2007   Hyperlipidemia associated with type 2 diabetes mellitus (HCC) 09/11/2006   Overweight (BMI 25.0-29.9) 09/11/2006   Essential hypertension 09/11/2006    ROS    Objective:     BP (!) 165/83   Pulse 90   Ht 5' 2 (1.575 m)   Wt 152 lb 1.3 oz (69 kg)   SpO2 97%   BMI 27.82 kg/m  BP Readings from Last 3 Encounters:  08/06/24 (!) 165/83  07/30/24 130/80  03/26/24 138/80   Wt Readings from Last 3 Encounters:  08/06/24 152 lb 1.3 oz (69 kg)  07/30/24 151 lb 12.8 oz (68.9 kg)  03/26/24 154 lb 12.8 oz (70.2 kg)      Physical Exam Vitals and nursing note reviewed.  Constitutional:      Appearance: Normal appearance.  HENT:     Head: Normocephalic.  Eyes:     Extraocular Movements: Extraocular movements intact.     Pupils: Pupils are equal, round, and reactive to light.  Cardiovascular:     Rate and Rhythm: Normal rate and regular rhythm.  Pulmonary:     Effort: Pulmonary effort is normal.     Breath sounds: Normal breath sounds.  Musculoskeletal:     Cervical back:  Normal range of motion and neck supple.  Neurological:     Mental Status: She is alert and oriented to person, place, and time.  Psychiatric:        Mood and Affect: Mood normal.        Thought Content: Thought content normal.      No results found for any visits on 08/06/24.    The ASCVD Risk score (Arnett DK, et al., 2019) failed to calculate for the following reasons:   The valid total cholesterol range is 130 to 320 mg/dL    Assessment & Plan:   Problem List Items Addressed This Visit       Musculoskeletal and Integument   Tinea unguium - Primary    Rash under breast consistent with yeast infection, common in warm, moist areas. Small bump likely clogged pore. - Order mammogram.      Relevant Medications   ketoconazole (NIZORAL) 2 % cream   Other Visit Diagnoses       Encounter for screening mammogram for malignant neoplasm of breast       Mammogram screening up to date, last in December 2022   Relevant Orders   MM 3D DIAGNOSTIC MAMMOGRAM BILATERAL BREAST      No follow-ups on file.    Leita Longs, FNP

## 2024-08-08 DIAGNOSIS — B351 Tinea unguium: Secondary | ICD-10-CM | POA: Insufficient documentation

## 2024-08-08 NOTE — Assessment & Plan Note (Signed)
 Rash under breast consistent with yeast infection, common in warm, moist areas. Small bump likely clogged pore. - Order mammogram.

## 2024-09-13 ENCOUNTER — Other Ambulatory Visit (HOSPITAL_COMMUNITY): Payer: Self-pay

## 2024-09-13 DIAGNOSIS — Z1231 Encounter for screening mammogram for malignant neoplasm of breast: Secondary | ICD-10-CM

## 2024-09-14 ENCOUNTER — Ambulatory Visit

## 2024-09-16 ENCOUNTER — Ambulatory Visit

## 2024-09-23 ENCOUNTER — Inpatient Hospital Stay (HOSPITAL_COMMUNITY): Admission: RE | Admit: 2024-09-23 | Discharge: 2024-09-23

## 2024-09-23 ENCOUNTER — Encounter (HOSPITAL_COMMUNITY): Payer: Self-pay

## 2024-09-23 DIAGNOSIS — Z1231 Encounter for screening mammogram for malignant neoplasm of breast: Secondary | ICD-10-CM | POA: Insufficient documentation

## 2024-10-01 ENCOUNTER — Ambulatory Visit (INDEPENDENT_AMBULATORY_CARE_PROVIDER_SITE_OTHER)

## 2024-10-01 DIAGNOSIS — E663 Overweight: Secondary | ICD-10-CM | POA: Diagnosis not present

## 2024-10-01 DIAGNOSIS — E559 Vitamin D deficiency, unspecified: Secondary | ICD-10-CM | POA: Diagnosis not present

## 2024-10-01 DIAGNOSIS — Z7984 Long term (current) use of oral hypoglycemic drugs: Secondary | ICD-10-CM | POA: Diagnosis not present

## 2024-10-01 DIAGNOSIS — I10 Essential (primary) hypertension: Secondary | ICD-10-CM | POA: Diagnosis not present

## 2024-10-01 DIAGNOSIS — E1165 Type 2 diabetes mellitus with hyperglycemia: Secondary | ICD-10-CM | POA: Diagnosis not present

## 2024-10-01 NOTE — Progress Notes (Unsigned)
 "  Established Patient Office Visit  Subjective   Patient ID: Jennifer Velasquez, female    DOB: 12/31/1955  Age: 68 y.o. MRN: [id number]  Chief Complaint  Patient presents with   Medical Management of Chronic Issues    6 month follow up    HPI Discussed the use of AI scribe software for clinical note transcription with the patient, who gave verbal consent to proceed.  History of Present Illness    Jennifer Velasquez is a 68 year old female with type 2 diabetes mellitus who presents for follow-up and medication refills.  Upper respiratory symptoms - Congestion began yesterday - No fever - No chills - No facial pain  Glycemic control - Type 2 diabetes mellitus managed with metformin  (two pills twice daily) and Jardiance  - Last hemoglobin A1c was 9.1; a year ago it was 8.2 - No foot problems     Patient Active Problem List   Diagnosis Date Noted   Tinea unguium 08/08/2024   Elevated antinuclear antibody (ANA) level 07/30/2024   Seasonal and perennial allergic rhinitis 07/30/2024   Dysfunction of both eustachian tubes 07/30/2024   Osteopenia 03/11/2024   Chronic urticaria 01/26/2024   Colon cancer screening 09/04/2023   Dysuria 03/13/2023   Right hand pain 03/13/2023   Left hip pain 03/13/2023   Encounter for well adult exam with abnormal findings 03/13/2023   Need for zoster vaccination 09/03/2022   Rash and nonspecific skin eruption 08/16/2022   Atopic dermatitis 12/13/2021   Immunization due 03/29/2021   Hemorrhoid 03/29/2021   Annual visit for general adult medical examination with abnormal findings 07/20/2020   Post-menopausal 07/20/2020   Encounter for screening for malignant neoplasm of colon 07/20/2020   Carpal tunnel syndrome on right 07/20/2020   Pap smear of cervix declined 07/20/2020   Need for immunization against influenza 07/20/2020   Vitamin D  deficiency 12/01/2011   Lumbago 07/20/2007   Type 2 diabetes mellitus with hyperglycemia (HCC) 01/07/2007    Hyperlipidemia associated with type 2 diabetes mellitus (HCC) 09/11/2006   Overweight (BMI 25.0-29.9) 09/11/2006   Essential hypertension 09/11/2006    ROS    Objective:     BP 108/74   Pulse (!) 107   Ht 5' 2 (1.575 m)   Wt 151 lb (68.5 kg)   SpO2 95%   BMI 27.62 kg/m  BP Readings from Last 3 Encounters:  10/01/24 108/74  08/06/24 (!) 165/83  07/30/24 130/80   Wt Readings from Last 3 Encounters:  10/01/24 151 lb (68.5 kg)  08/06/24 152 lb 1.3 oz (69 kg)  07/30/24 151 lb 12.8 oz (68.9 kg)     Physical Exam Vitals and nursing note reviewed.  Constitutional:      Appearance: Normal appearance.  HENT:     Head: Normocephalic.  Eyes:     Extraocular Movements: Extraocular movements intact.     Pupils: Pupils are equal, round, and reactive to light.  Cardiovascular:     Rate and Rhythm: Normal rate and regular rhythm.  Pulmonary:     Effort: Pulmonary effort is normal.     Breath sounds: Normal breath sounds.  Musculoskeletal:     Cervical back: Normal range of motion and neck supple.  Neurological:     Mental Status: She is alert and oriented to person, place, and time.  Psychiatric:        Mood and Affect: Mood normal.        Thought Content: Thought content normal.     Diabetic  foot exam was performed with the following findings:   No deformities, ulcerations, or other skin breakdown Normal sensation of 10g monofilament Intact posterior tibialis and dorsalis pedis pulses      The ASCVD Risk score (Arnett DK, et al., 2019) failed to calculate for the following reasons:   The valid total cholesterol range is 130 to 320 mg/dL    Assessment & Plan:   Problem List Items Addressed This Visit       Cardiovascular and Mediastinum   Essential hypertension   Adequately controlled on current antihypertensive regimen.  No changes made.        Relevant Orders   TSH + free T4 (Completed)     Endocrine   Type 2 diabetes mellitus with hyperglycemia (HCC)    Chronic condition with poor glycemic control, A1c increased to 9.1 from 8.2. - Ordered lab work to assess glycemic control. - Continue metformin  and Jardiance .      Relevant Orders   TSH + free T4 (Completed)   HgB A1c (Completed)   CMP14+EGFR (Completed)   CBC with Differential (Completed)   HM Diabetes Foot Exam (Completed)     Other   Overweight (BMI 25.0-29.9)   Check fasting labs today.       Relevant Orders   Vitamin D  (25 hydroxy) (Completed)   TSH + free T4 (Completed)   HgB A1c (Completed)   CMP14+EGFR (Completed)   CBC with Differential (Completed)   Vitamin D  deficiency   Recheck vitamin D  level.       Relevant Orders   Vitamin D  (25 hydroxy) (Completed)   Return in about 4 months (around 01/30/2025) for chronic follow-up with PCP.    Jennifer Longs, FNP  "

## 2024-10-02 LAB — CMP14+EGFR
ALT: 11 IU/L (ref 0–32)
AST: 13 IU/L (ref 0–40)
Albumin: 4.3 g/dL (ref 3.9–4.9)
Alkaline Phosphatase: 101 IU/L (ref 49–135)
BUN/Creatinine Ratio: 13 (ref 12–28)
BUN: 12 mg/dL (ref 8–27)
Bilirubin Total: 0.7 mg/dL (ref 0.0–1.2)
CO2: 20 mmol/L (ref 20–29)
Calcium: 9.6 mg/dL (ref 8.7–10.3)
Chloride: 104 mmol/L (ref 96–106)
Creatinine, Ser: 0.89 mg/dL (ref 0.57–1.00)
Globulin, Total: 2.9 g/dL (ref 1.5–4.5)
Glucose: 156 mg/dL — ABNORMAL HIGH (ref 70–99)
Potassium: 4.4 mmol/L (ref 3.5–5.2)
Sodium: 142 mmol/L (ref 134–144)
Total Protein: 7.2 g/dL (ref 6.0–8.5)
eGFR: 70 mL/min/1.73

## 2024-10-02 LAB — CBC WITH DIFFERENTIAL/PLATELET
Basophils Absolute: 0 x10E3/uL (ref 0.0–0.2)
Basos: 0 %
EOS (ABSOLUTE): 0.1 x10E3/uL (ref 0.0–0.4)
Eos: 2 %
Hematocrit: 45.7 % (ref 34.0–46.6)
Hemoglobin: 14.9 g/dL (ref 11.1–15.9)
Immature Grans (Abs): 0 x10E3/uL (ref 0.0–0.1)
Immature Granulocytes: 0 %
Lymphocytes Absolute: 1.9 x10E3/uL (ref 0.7–3.1)
Lymphs: 31 %
MCH: 29.4 pg (ref 26.6–33.0)
MCHC: 32.6 g/dL (ref 31.5–35.7)
MCV: 90 fL (ref 79–97)
Monocytes Absolute: 0.7 x10E3/uL (ref 0.1–0.9)
Monocytes: 11 %
Neutrophils Absolute: 3.4 x10E3/uL (ref 1.4–7.0)
Neutrophils: 55 %
Platelets: 331 x10E3/uL (ref 150–450)
RBC: 5.07 x10E6/uL (ref 3.77–5.28)
RDW: 13.1 % (ref 11.7–15.4)
WBC: 6.2 x10E3/uL (ref 3.4–10.8)

## 2024-10-02 LAB — HEMOGLOBIN A1C
Est. average glucose Bld gHb Est-mCnc: 194 mg/dL
Hgb A1c MFr Bld: 8.4 % — ABNORMAL HIGH (ref 4.8–5.6)

## 2024-10-02 LAB — TSH+FREE T4
Free T4: 1.16 ng/dL (ref 0.82–1.77)
TSH: 1.41 u[IU]/mL (ref 0.450–4.500)

## 2024-10-02 LAB — VITAMIN D 25 HYDROXY (VIT D DEFICIENCY, FRACTURES): Vit D, 25-Hydroxy: 29.7 ng/mL — ABNORMAL LOW (ref 30.0–100.0)

## 2024-10-03 ENCOUNTER — Ambulatory Visit: Payer: Self-pay

## 2024-10-03 ENCOUNTER — Other Ambulatory Visit: Payer: Self-pay

## 2024-10-03 DIAGNOSIS — E559 Vitamin D deficiency, unspecified: Secondary | ICD-10-CM

## 2024-10-03 MED ORDER — VITAMIN D (ERGOCALCIFEROL) 1.25 MG (50000 UNIT) PO CAPS: 50000.0000 [IU] | ORAL_CAPSULE | ORAL | 5 refills | Status: AC

## 2024-10-03 NOTE — Assessment & Plan Note (Signed)
 Chronic condition with poor glycemic control, A1c increased to 9.1 from 8.2. - Ordered lab work to assess glycemic control. - Continue metformin  and Jardiance .

## 2024-10-03 NOTE — Assessment & Plan Note (Signed)
 Recheck vitamin D  level

## 2024-10-03 NOTE — Assessment & Plan Note (Signed)
Check fasting labs today. 

## 2024-10-03 NOTE — Assessment & Plan Note (Signed)
 Adequately controlled on current antihypertensive regimen.  No changes made.

## 2024-10-05 ENCOUNTER — Telehealth: Payer: Self-pay

## 2024-10-05 NOTE — Telephone Encounter (Signed)
 Mailed

## 2024-10-05 NOTE — Telephone Encounter (Signed)
 Copied from CRM [id number]. Topic: Clinical - Lab/Test Results >> Oct 05, 2024  8:51 AM Avram MATSU wrote: Reason for CRM: patient would like her recent labs results mailed out to her.

## 2024-10-15 NOTE — Progress Notes (Unsigned)
 "  Office Visit Note  Patient: Jennifer Velasquez             Date of Birth: [birth year]           MRN: [id number]             PCP: Bevely Doffing, FNP Referring: Iva Marty Saltness, MD Visit Date: 10/28/2024 Occupation: Data Unavailable  Subjective:  No chief complaint on file.   History of Present Illness: Jennifer Velasquez is a 69 y.o. female ***     Activities of Daily Living:  Patient reports morning stiffness for *** {minute/hour:19697}.   Patient {ACTIONS;DENIES/REPORTS:21021675::Denies} nocturnal pain.  Difficulty dressing/grooming: {ACTIONS;DENIES/REPORTS:21021675::Denies} Difficulty climbing stairs: {ACTIONS;DENIES/REPORTS:21021675::Denies} Difficulty getting out of chair: {ACTIONS;DENIES/REPORTS:21021675::Denies} Difficulty using hands for taps, buttons, cutlery, and/or writing: {ACTIONS;DENIES/REPORTS:21021675::Denies}  No Rheumatology ROS completed.   PMFS History:  Patient Active Problem List   Diagnosis Date Noted   Tinea unguium 08/08/2024   Elevated antinuclear antibody (ANA) level 07/30/2024   Seasonal and perennial allergic rhinitis 07/30/2024   Dysfunction of both eustachian tubes 07/30/2024   Osteopenia 03/11/2024   Chronic urticaria 01/26/2024   Colon cancer screening 09/04/2023   Dysuria 03/13/2023   Right hand pain 03/13/2023   Left hip pain 03/13/2023   Encounter for well adult exam with abnormal findings 03/13/2023   Need for zoster vaccination 09/03/2022   Rash and nonspecific skin eruption 08/16/2022   Atopic dermatitis 12/13/2021   Immunization due 03/29/2021   Hemorrhoid 03/29/2021   Annual visit for general adult medical examination with abnormal findings 07/20/2020   Post-menopausal 07/20/2020   Encounter for screening for malignant neoplasm of colon 07/20/2020   Carpal tunnel syndrome on right 07/20/2020   Pap smear of cervix declined 07/20/2020   Need for immunization against influenza 07/20/2020   Vitamin D  deficiency 12/01/2011    Lumbago 07/20/2007   Type 2 diabetes mellitus with hyperglycemia (HCC) 01/07/2007   Hyperlipidemia associated with type 2 diabetes mellitus (HCC) 09/11/2006   Overweight (BMI 25.0-29.9) 09/11/2006   Essential hypertension 09/11/2006    Past Medical History:  Diagnosis Date   Back pain    CARPAL TUNNEL SYNDROME, LEFT 06/02/2008   Qualifier: Diagnosis of  By: Karren MD, Cornelius     COVID-19 virus infection 09/29/2019   DEPRESSION, SITUATIONAL 11/25/2008   Qualifier: Diagnosis of  Problem Stop Reason:  By: Karren MD, Jomarie Rossetti about COVID-19 virus infection 10/21/2019   LYMPHADENOPATHY 05/28/2010   Qualifier: Diagnosis of  By: Windell PA, Dawn     Neck pain    left    Need for prophylactic hormone replacement therapy (postmenopausal)    Obesity, unspecified    Other and unspecified hyperlipidemia    Routine general medical examination at a health care facility    Trichomonal vaginitis    Type II or unspecified type diabetes mellitus without mention of complication, not stated as uncontrolled    Unspecified constipation    Unspecified essential hypertension    Urticaria     Family History  Problem Relation Age of Onset   Pneumonia Mother    Transient ischemic attack Father    Hypertension Father    Cancer Father    Hypertension Sister    Hypertension Sister    Breast cancer Daughter 55   Cancer Daughter        breast    Past Surgical History:  Procedure Laterality Date   ADENOIDECTOMY     APPENDECTOMY     DENTAL SURGERY  TOTAL ABDOMINAL HYSTERECTOMY     fibroids   Social History[1] Social History   Social History Narrative   Lives with daughter Cecile) and granddaughter Vivia)   Son (antwan- lives in [place]) 2 grand babies (boys)      Enjoys spends time with Joesph, reads books, spends time with friend      Diet: bread, pasta, veggies, bacon    Caffeine: frozen coffee, soda -daily   Water: 3 bottles of water      Wears seat belt    Doesn't not wear sunscreen   Smoke detectors   Report not using phone while driving      Immunization History  Administered Date(s) Administered   Fluad Quad(high Dose 65+) 07/20/2020, 09/14/2021, 07/29/2022   Fluad Trivalent(High Dose 65+) 09/04/2023   H1N1 09/12/2008   INFLUENZA, HIGH DOSE SEASONAL PF 07/30/2024   Influenza Whole 07/20/2007, 06/30/2008   Influenza,inj,Quad PF,6+ Mos 07/22/2018, 06/28/2019   Moderna SARS-COV2 Booster Vaccination 10/21/2019, 11/18/2019, 08/14/2020, 04/11/2021   PNEUMOCOCCAL CONJUGATE-20 08/09/2022   PPD Test 08/25/2015   Pneumococcal Conjugate-13 03/29/2021   Pneumococcal Polysaccharide-23 04/23/2007   Tdap 02/07/2018   Zoster Recombinant(Shingrix ) 12/13/2021, 08/29/2022     Objective: Vital Signs: There were no vitals taken for this visit.   Physical Exam   Musculoskeletal Exam: ***  CDAI Exam: CDAI Score: -- Patient Global: --; Provider Global: -- Swollen: --; Tender: -- Joint Exam 10/28/2024   No joint exam has been documented for this visit   There is currently no information documented on the homunculus. Go to the Rheumatology activity and complete the homunculus joint exam.  Investigation: No additional findings.  Imaging: MM 3D SCREENING MAMMOGRAM BILATERAL BREAST Result Date: 09/29/2024 CLINICAL DATA:  Screening. EXAM: DIGITAL SCREENING BILATERAL MAMMOGRAM WITH TOMOSYNTHESIS AND CAD TECHNIQUE: Bilateral screening digital craniocaudal and mediolateral oblique mammograms were obtained. Bilateral screening digital breast tomosynthesis was performed. The images were evaluated with computer-aided detection. COMPARISON:  Previous exam(s). ACR Breast Density Category b: There are scattered areas of fibroglandular density. FINDINGS: There are no findings suspicious for malignancy. IMPRESSION: No mammographic evidence of malignancy. A result letter of this screening mammogram will be mailed directly to the patient. RECOMMENDATION:  Screening mammogram in one year. (Code:SM-B-01Y) BI-RADS CATEGORY  1: Negative. Electronically Signed   By: Dina  Arceo M.D.   On: 09/29/2024 06:14    Recent Labs: Lab Results  Component Value Date   WBC 6.2 10/01/2024   HGB 14.9 10/01/2024   PLT 331 10/01/2024   NA 142 10/01/2024   K 4.4 10/01/2024   CL 104 10/01/2024   CO2 20 10/01/2024   GLUCOSE 156 (H) 10/01/2024   BUN 12 10/01/2024   CREATININE 0.89 10/01/2024   BILITOT 0.7 10/01/2024   ALKPHOS 101 10/01/2024   AST 13 10/01/2024   ALT 11 10/01/2024   PROT 7.2 10/01/2024   ALBUMIN 4.3 10/01/2024   CALCIUM  9.6 10/01/2024   GFRAA 92 07/20/2020  October 01, 2024 vitamin D29.7, hemoglobin A1c 8.4, TSH normal March 26, 2024 TPO antibody negative, thyroglobulin antibody negative, ANA 1: 640NS, CRP 2, sed rate 15 Mar 11, 2024 vitamin D  normal, folate normal  Speciality Comments: No specialty comments available.  Procedures:  No procedures performed Allergies: Pravastatin sodium   Assessment / Plan:     Visit Diagnoses: Positive ANA (antinuclear antibody)  Left hip pain  Right hand pain  Acute midline low back pain without sciatica  Osteopenia of multiple sites  Vitamin D  deficiency  Rash and nonspecific skin eruption  Type 2 diabetes mellitus with hyperglycemia, without long-term current use of insulin (HCC)  Tinea unguium  Essential hypertension  Hyperlipidemia associated with type 2 diabetes mellitus (HCC)  Hemorrhoids, unspecified hemorrhoid type  Seasonal and perennial allergic rhinitis  Orders: No orders of the defined types were placed in this encounter.  No orders of the defined types were placed in this encounter.   Face-to-face time spent with patient was *** minutes. Greater than 50% of time was spent in counseling and coordination of care.  Follow-Up Instructions: No follow-ups on file.   Maya Nash, MD  Note - This record has been created using Animal nutritionist.  Chart creation  errors have been sought, but may not always  have been located. Such creation errors do not reflect on  the standard of medical care.    [1]  Social History Tobacco Use   Smoking status: Former    Types: Cigarettes   Smokeless tobacco: Never  Vaping Use   Vaping status: Never Used  Substance Use Topics   Alcohol use: Yes   Drug use: No   "

## 2024-10-28 ENCOUNTER — Encounter: Admitting: Rheumatology

## 2024-10-28 DIAGNOSIS — M8589 Other specified disorders of bone density and structure, multiple sites: Secondary | ICD-10-CM

## 2024-10-28 DIAGNOSIS — M79641 Pain in right hand: Secondary | ICD-10-CM

## 2024-10-28 DIAGNOSIS — L509 Urticaria, unspecified: Secondary | ICD-10-CM

## 2024-10-28 DIAGNOSIS — E559 Vitamin D deficiency, unspecified: Secondary | ICD-10-CM

## 2024-10-28 DIAGNOSIS — J302 Other seasonal allergic rhinitis: Secondary | ICD-10-CM

## 2024-10-28 DIAGNOSIS — K649 Unspecified hemorrhoids: Secondary | ICD-10-CM

## 2024-10-28 DIAGNOSIS — M25552 Pain in left hip: Secondary | ICD-10-CM

## 2024-10-28 DIAGNOSIS — M545 Low back pain, unspecified: Secondary | ICD-10-CM

## 2024-10-28 DIAGNOSIS — E1165 Type 2 diabetes mellitus with hyperglycemia: Secondary | ICD-10-CM

## 2024-10-28 DIAGNOSIS — B351 Tinea unguium: Secondary | ICD-10-CM

## 2024-10-28 DIAGNOSIS — R7689 Other specified abnormal immunological findings in serum: Secondary | ICD-10-CM

## 2024-10-28 DIAGNOSIS — E1169 Type 2 diabetes mellitus with other specified complication: Secondary | ICD-10-CM

## 2024-10-28 DIAGNOSIS — I10 Essential (primary) hypertension: Secondary | ICD-10-CM

## 2024-11-04 NOTE — Patient Instructions (Addendum)
 Allergic rhinitis Continue allergen avoidance measures directed toward grass pollen, tree pollen, indoor mold, cat, and dog as listed below Continue an antihistamine once or twice a day if needed for runny nose or itch Continue Flonase nasal spray 2 sprays in each nostril once a day for a stuffy nose.  In the right nostril, point the applicator out toward the right ear. In the left nostril, point the applicator out toward the left ear Consider saline nasal rinses as needed for nasal symptoms. Use this before any medicated nasal sprays for best result Consider allergen immunotherapy if your symptoms are not well-controlled with the treatment plan as listed above  Eustachian tube dysfunction Continue nasal saline rinses Continue Flonase as listed above  Hives (urticaria) Take the least amount of medications while remaining hive free Cetirizine (Zyrtec) 10mg  twice a day and famotidine  (Pepcid ) 20 mg twice a day. If no symptoms for 7-14 days then decrease to Cetirizine (Zyrtec) 10mg  twice a day and famotidine  (Pepcid ) 20 mg once a day.  If no symptoms for 7-14 days then decrease to Cetirizine (Zyrtec) 10mg  twice a day.  If no symptoms for 7-14 days then decrease to Cetirizine (Zyrtec) 10mg  once a day.  May use Benadryl  (diphenhydramine ) as needed for breakthrough hives       If symptoms return, then step up dosage  Keep a detailed symptom journal including foods eaten, contact with allergens, medications taken, weather changes.  Consider Xolair injections for control of urticaria  Dermatitis Begin Elidel  to red and itchy areas on your legs up to twice a day if needed Continue triamcinolone  to red and itchy areas underneath your face up to twice a day if needed Continue ketoconazole  cream to red and itchy patches under your breasts if needed  Elevated ANA Continue to follow up with your rheumatology specialist as recommended  Call the clinic if this treatment plan is not working well  for you.  Follow up in 6 months or sooner if needed.  Reducing Pollen Exposure The American Academy of All[employer] , Asthma and Immunology suggests the following steps to reduce your exposure to pollen during allergy  seasons. Do not hang sheets or clothing out to dry; pollen may collect on these items. Do not mow lawns or spend time around freshly cut grass; mowing stirs up pollen. Keep windows closed at night.  Keep car windows closed while driving. Minimize morning activities outdoors, a time when pollen counts are usually at their highest. Stay indoors as much as possible when pollen counts or humidity is high and on windy days when pollen tends to remain in the air longer. Use air conditioning when possible.  Many air conditioners have filters that trap the pollen spores. Use a HEPA room air filter to remove pollen form the indoor air you breathe.  Control of Mold Allergen Mold and fungi can grow on a variety of surfaces provided certain temperature and moisture conditions exist.  Outdoor molds grow on plants, decaying vegetation and soil.  The major outdoor mold, Alternaria and Cladosporium, are found in very high numbers during hot and dry conditions.  Generally, a late Summer - Fall peak is seen for common outdoor fungal spores.  Rain will temporarily lower outdoor mold spore count, but counts rise rapidly when the rainy period ends.  The most important indoor molds are Aspergillus and Penicillium.  Dark, humid and poorly ventilated basements are ideal sites for mold growth.  The next most common sites of mold growth are the bathroom and the kitchen.  Outdoor [employer] Use air conditioning and keep windows closed Avoid exposure to decaying vegetation. Avoid leaf raking. Avoid grain handling. Consider wearing a face mask if working in moldy areas.  Indoor Mold Control Maintain humidity below 50%. Clean washable surfaces with 5% bleach solution. Remove sources e.g. Contaminated  carpets.  Control of Dog or Cat Allergen Avoidance is the best way to manage a dog or cat allergy . If you have a dog or cat and are allergic to dog or cats, consider removing the dog or cat from the home. If you have a dog or cat but dont want to find it a new home, or if your family wants a pet even though someone in the household is allergic, here are some strategies that may help keep symptoms at bay:  Keep the pet out of your bedroom and restrict it to only a few rooms. Be advised that keeping the dog or cat in only one room will not limit the allergens to that room. Dont pet, hug or kiss the dog or cat; if you do, wash your hands with soap and water. High-efficiency particulate air (HEPA) cleaners run continuously in a bedroom or living room can reduce allergen levels over time. Regular use of a high-efficiency vacuum cleaner or a central vacuum can reduce allergen levels. Giving your dog or cat a bath at least once a week can reduce airborne allergen.

## 2024-11-04 NOTE — Progress Notes (Signed)
 "  [address] [place] [address] Dept: [phone]  FOLLOW UP NOTE  Patient ID: Jennifer Velasquez, female    DOB: 1956-06-02  Age: 69 y.o. MRN: [id number] Date of Office Visit: 11/05/2024  Assessment  Chief Complaint: Eustachian tube dysfunction, Elevated ANA, Urticaria, Seasonal and perennial allergic rhinitis, and Follow-up  HPI Jennifer Velasquez is a 69 year old female who presents to the clinic for follow-up visit.  She was last seen in this clinic on 07/30/2024 by Arlean Mutter, FNP, for evaluation of allergic rhinitis, urticaria, elevated ANA and eustachian tube dysfunction.  Discussed the use of AI scribe software for clinical note transcription with the patient, who gave verbal consent to proceed.  History of Present Illness Jennifer Velasquez is a 68 year old female who presents for follow-up regarding her skin condition and allergy  management.  At today's visit, she reports that her hives are well controlled with no outbreaks since her last visit to this clinic.  She continues cetirizine 10 mg once a day and is not currently taking famotidine  to control itch.  She reports that if she forgets to take cetirizine for 1 or 2 days, then she begins to feel itchy all over.    She reports occasionally experiencing randomly occurring rough red areas on her legs for which she is currently using ketoconazole  cream which was prescribed for yeast outbreak under her breasts. She reports some relief of symptoms after applying ketakonazole cream.   Allergic rhinitis is reported as well-controlled with no symptoms including rhinorrhea, nasal congestion, sneezing, or postnasal drainage.  She continues cetirizine daily and is not currently using Flonase or nasal saline rinses.  She denies symptoms of allergic conjunctivitis. Her last environmental allergy  skin testing on 04/09/2024 was positive to grass pollen, tree pollen, indoor mold, cat, and dog.   Eustachian tube dysfunction is reported as  moderately well-controlled with occasional popping in her ears with recent transient pain in her right ear which has resolved on its own.  She is not currently using a nasal steroid or nasal saline rinse.  She denies change in hearing or drainage from either ear.  She had a new patient appointment with rheumatology for evaluation of elevated ANA, however, she reports that she got lost on the way to the appointment and was too late to be seen.  She reports that she has rescheduled this appointment for 01/26/2025.  Her current medications are listed in the chart.  Drug Allergies:  Allergies[1]  Physical Exam: BP (!) 140/84   Pulse 84   Temp 97.6 F (36.4 C) (Temporal)   Ht 5' 2 (1.575 m)   Wt 154 lb 9.6 oz (70.1 kg)   SpO2 100%   BMI 28.28 kg/m    Physical Exam Vitals reviewed.  Constitutional:      Appearance: Normal appearance.  HENT:     Head: Normocephalic and atraumatic.     Right Ear: Tympanic membrane normal.     Left Ear: Tympanic membrane normal.     Nose:     Comments: Bilateral nares normal. Pharynx normal. Ears normal. Eyes normal    Mouth/Throat:     Pharynx: Oropharynx is clear.  Eyes:     Conjunctiva/sclera: Conjunctivae normal.  Cardiovascular:     Rate and Rhythm: Normal rate and regular rhythm.     Heart sounds: Normal heart sounds. No murmur heard. Pulmonary:     Effort: Pulmonary effort is normal.     Breath sounds: Normal breath sounds.  Comments: Lungs clear to auscultation Musculoskeletal:        General: Normal range of motion.     Cervical back: Normal range of motion and neck supple.  Skin:    General: Skin is warm and dry.  Neurological:     Mental Status: She is alert and oriented to person, place, and time.  Psychiatric:        Mood and Affect: Mood normal.        Behavior: Behavior normal.        Thought Content: Thought content normal.        Judgment: Judgment normal.    Assessment and Plan: 1. Seasonal and perennial allergic  rhinitis   2. Chronic urticaria   3. Elevated antinuclear antibody (ANA) level   4. Pruritus   5. Dysfunction of both eustachian tubes     Meds ordered this encounter  Medications   ketoconazole  (NIZORAL ) 2 % cream    Sig: Apply topically to to red and itchy patches on your legs up to twice a day if needed.    Dispense:  60 g    Refill:  1   pimecrolimus  (ELIDEL ) 1 % cream    Sig: Apply topically 2 (two) times daily.    Dispense:  30 g    Refill:  2    Patient Instructions  Allergic rhinitis Continue allergen avoidance measures directed toward grass pollen, tree pollen, indoor mold, cat, and dog as listed below Continue an antihistamine once or twice a day if needed for runny nose or itch Continue Flonase nasal spray 2 sprays in each nostril once a day for a stuffy nose.  In the right nostril, point the applicator out toward the right ear. In the left nostril, point the applicator out toward the left ear Consider saline nasal rinses as needed for nasal symptoms. Use this before any medicated nasal sprays for best result Consider allergen immunotherapy if your symptoms are not well-controlled with the treatment plan as listed above  Eustachian tube dysfunction Continue nasal saline rinses Continue Flonase as listed above  Hives (urticaria) Take the least amount of medications while remaining hive free Cetirizine (Zyrtec) 10mg  twice a day and famotidine  (Pepcid ) 20 mg twice a day. If no symptoms for 7-14 days then decrease to Cetirizine (Zyrtec) 10mg  twice a day and famotidine  (Pepcid ) 20 mg once a day.  If no symptoms for 7-14 days then decrease to Cetirizine (Zyrtec) 10mg  twice a day.  If no symptoms for 7-14 days then decrease to Cetirizine (Zyrtec) 10mg  once a day.  May use Benadryl  (diphenhydramine ) as needed for breakthrough hives       If symptoms return, then step up dosage  Keep a detailed symptom journal including foods eaten, contact with allergens, medications  taken, weather changes.  Consider Xolair injections for control of urticaria  Dermatitis Begin Elidel  to red and itchy areas on your legs up to twice a day if needed Continue triamcinolone  to red and itchy areas underneath your face up to twice a day if needed Continue ketoconazole  cream to red and itchy patches under your breasts if needed  Elevated ANA Continue to follow up with your rheumatology specialist as recommended  Call the clinic if this treatment plan is not working well for you.  Follow up in 6 months or sooner if needed.   Return in about 6 months (around 05/05/2025), or if symptoms worsen or fail to improve.    Thank you for the opportunity to care for  this patient.  Please do not hesitate to contact me with questions.  Arlean Mutter, FNP [facility]             [1]  Allergies Allergen Reactions   Pravastatin Sodium     REACTION: sore throat   "

## 2024-11-05 ENCOUNTER — Other Ambulatory Visit: Payer: Self-pay

## 2024-11-05 ENCOUNTER — Other Ambulatory Visit: Payer: Self-pay | Admitting: Family Medicine

## 2024-11-05 ENCOUNTER — Ambulatory Visit: Admitting: Family Medicine

## 2024-11-05 ENCOUNTER — Encounter: Payer: Self-pay | Admitting: Family Medicine

## 2024-11-05 VITALS — BP 140/84 | HR 84 | Temp 97.6°F | Ht 62.0 in | Wt 154.6 lb

## 2024-11-05 DIAGNOSIS — L508 Other urticaria: Secondary | ICD-10-CM | POA: Diagnosis not present

## 2024-11-05 DIAGNOSIS — L299 Pruritus, unspecified: Secondary | ICD-10-CM | POA: Diagnosis not present

## 2024-11-05 DIAGNOSIS — H6993 Unspecified Eustachian tube disorder, bilateral: Secondary | ICD-10-CM | POA: Diagnosis not present

## 2024-11-05 DIAGNOSIS — R7689 Other specified abnormal immunological findings in serum: Secondary | ICD-10-CM

## 2024-11-05 DIAGNOSIS — J302 Other seasonal allergic rhinitis: Secondary | ICD-10-CM | POA: Diagnosis not present

## 2024-11-05 DIAGNOSIS — J3089 Other allergic rhinitis: Secondary | ICD-10-CM

## 2024-11-05 MED ORDER — KETOCONAZOLE 2 % EX CREA: TOPICAL_CREAM | CUTANEOUS | 1 refills | Status: AC

## 2024-11-05 MED ORDER — PIMECROLIMUS 1 % EX CREA: TOPICAL_CREAM | Freq: Two times a day (BID) | CUTANEOUS | 2 refills | Status: DC

## 2024-11-05 NOTE — Telephone Encounter (Signed)
 Perfect. Apply to red and itchy areas up to twice a day. Thank you

## 2024-11-05 NOTE — Telephone Encounter (Signed)
 Insurance not covering Elidel , preferred is Tacrolimus . Okay to change?

## 2024-11-10 ENCOUNTER — Other Ambulatory Visit: Payer: Self-pay | Admitting: *Deleted

## 2024-11-10 MED ORDER — TACROLIMUS 0.03 % EX OINT: TOPICAL_OINTMENT | Freq: Two times a day (BID) | CUTANEOUS | 3 refills | Status: AC | PRN

## 2024-11-25 ENCOUNTER — Ambulatory Visit: Admitting: Rheumatology

## 2024-12-20 ENCOUNTER — Ambulatory Visit

## 2025-01-26 ENCOUNTER — Ambulatory Visit

## 2025-02-04 ENCOUNTER — Ambulatory Visit: Payer: Self-pay

## 2025-05-13 ENCOUNTER — Ambulatory Visit: Admitting: Allergy & Immunology
